# Patient Record
Sex: Female | Born: 1965 | Race: White | Hispanic: No | Marital: Married | Smoking: Never smoker
Health system: Southern US, Community
[De-identification: ages and names within clinical notes are randomized; demographics above are authoritative.]

## PROBLEM LIST (undated history)

## (undated) ENCOUNTER — Encounter

## (undated) ENCOUNTER — Telehealth

## (undated) ENCOUNTER — Ambulatory Visit

## (undated) ENCOUNTER — Other Ambulatory Visit

## (undated) DIAGNOSIS — N200 Calculus of kidney: Secondary | ICD-10-CM

## (undated) HISTORY — DX: Calculus of kidney: N20.0

## (undated) HISTORY — PX: REFRACTIVE SURGERY: SHX103

## (undated) HISTORY — PX: HERNIA REPAIR: SHX51

## (undated) HISTORY — PX: OTHER SURGICAL HISTORY: SHX169

## (undated) HISTORY — PX: CHOLECYSTECTOMY: SHX55

---

## 1996-10-12 HISTORY — PX: EYE SURGERY: SHX253

## 1998-10-12 HISTORY — PX: OTHER SURGICAL HISTORY: SHX169

## 2003-10-13 HISTORY — PX: OTHER SURGICAL HISTORY: SHX169

## 2005-10-12 HISTORY — PX: INFERIOR OBLIQUE MYECTOMY: SHX1814

## 2006-08-20 ENCOUNTER — Inpatient Hospital Stay (HOSPITAL_COMMUNITY): Admission: AD | Admit: 2006-08-20 | Discharge: 2006-08-20 | Payer: Self-pay | Admitting: Obstetrics and Gynecology

## 2007-03-04 ENCOUNTER — Ambulatory Visit (HOSPITAL_COMMUNITY): Admission: RE | Admit: 2007-03-04 | Discharge: 2007-03-04 | Payer: Self-pay | Admitting: Gynecology

## 2007-06-21 ENCOUNTER — Ambulatory Visit (HOSPITAL_COMMUNITY): Admission: RE | Admit: 2007-06-21 | Discharge: 2007-06-21 | Payer: Self-pay | Admitting: Nephrology

## 2007-09-01 ENCOUNTER — Ambulatory Visit (HOSPITAL_COMMUNITY): Admission: RE | Admit: 2007-09-01 | Discharge: 2007-09-01 | Payer: Self-pay | Admitting: Specialist

## 2007-09-02 ENCOUNTER — Ambulatory Visit (HOSPITAL_COMMUNITY): Admission: RE | Admit: 2007-09-02 | Discharge: 2007-09-02 | Payer: Self-pay | Admitting: Otolaryngology

## 2008-05-23 ENCOUNTER — Ambulatory Visit (HOSPITAL_COMMUNITY): Admission: RE | Admit: 2008-05-23 | Discharge: 2008-05-23 | Payer: Self-pay | Admitting: Obstetrics and Gynecology

## 2008-05-23 ENCOUNTER — Encounter (INDEPENDENT_AMBULATORY_CARE_PROVIDER_SITE_OTHER): Payer: Self-pay | Admitting: Obstetrics and Gynecology

## 2008-07-12 ENCOUNTER — Ambulatory Visit: Payer: Self-pay | Admitting: Internal Medicine

## 2008-08-03 ENCOUNTER — Ambulatory Visit: Payer: Self-pay | Admitting: Internal Medicine

## 2009-09-19 ENCOUNTER — Inpatient Hospital Stay (HOSPITAL_COMMUNITY): Admission: AD | Admit: 2009-09-19 | Discharge: 2009-09-21 | Payer: Self-pay | Admitting: Obstetrics and Gynecology

## 2009-11-14 ENCOUNTER — Inpatient Hospital Stay (HOSPITAL_COMMUNITY): Admission: RE | Admit: 2009-11-14 | Discharge: 2009-11-17 | Payer: Self-pay | Admitting: Obstetrics and Gynecology

## 2009-12-23 ENCOUNTER — Ambulatory Visit: Admission: RE | Admit: 2009-12-23 | Discharge: 2009-12-23 | Payer: Self-pay | Admitting: Obstetrics and Gynecology

## 2010-03-07 ENCOUNTER — Ambulatory Visit: Payer: Self-pay | Admitting: Internal Medicine

## 2010-04-24 IMAGING — US US OB TRANSVAGINAL
2 series · 13 of 28 positions shown · non-contrast
Comparison: Correlating renal ultrasound and prior CT from 3884

CLINICAL DATA: Evaluate the bladder due to poor imaging
transabdominally

TRANSVAGINAL OB ULTRASOUND
TECHNIQUE: Transvaginal ultrasound was performed for evaluation of
the gestation as well as the maternal uterus and adnexal regions.

[Series 1: us renal · 0.13mm/px · 4 of 18 slices shown (1 of 2)]
[im 3/18]
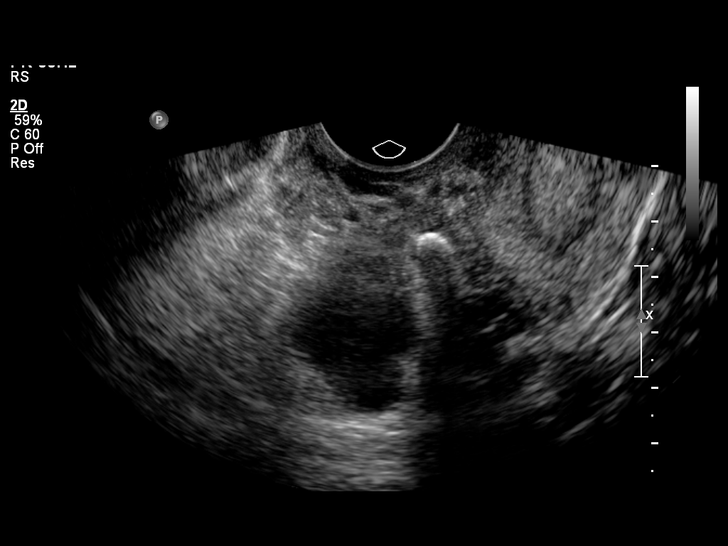
[im 7/18]
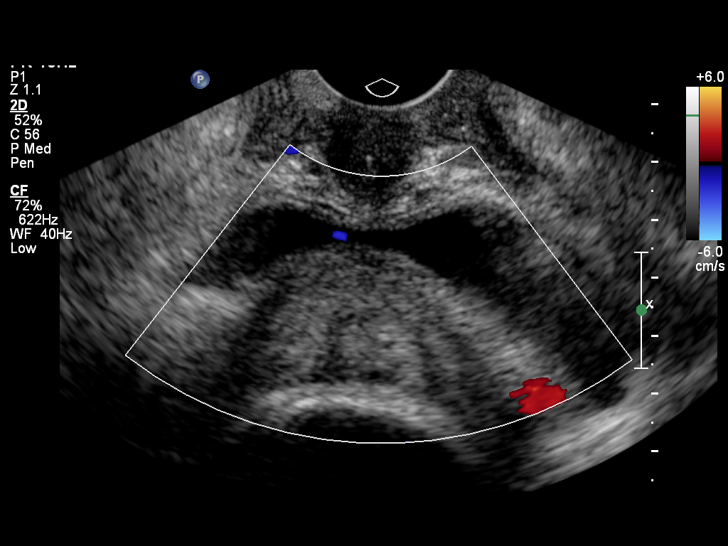
[im 11/18]
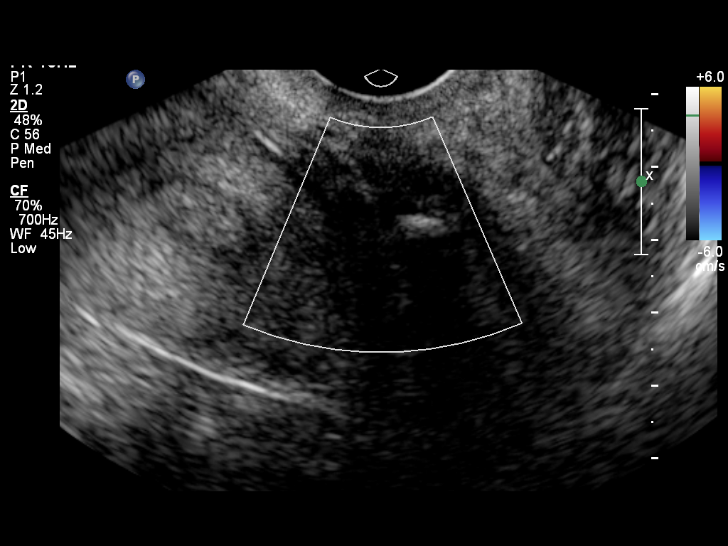
[im 15/18]
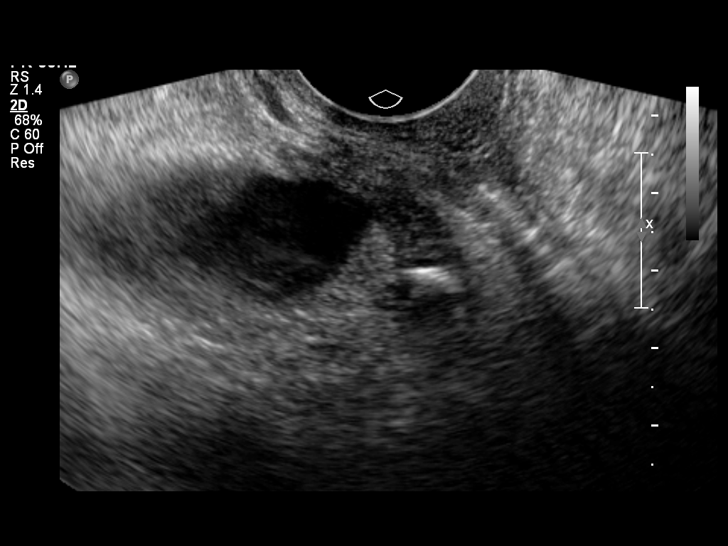

[Series 1: us renal · 0.27mm/px · 9 of 37 slices shown (2 of 2)]
[im 1/37]
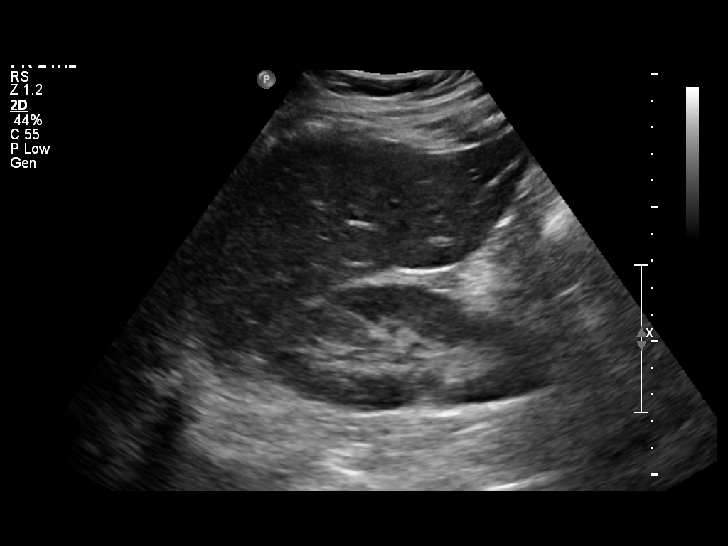
[im 5/37]
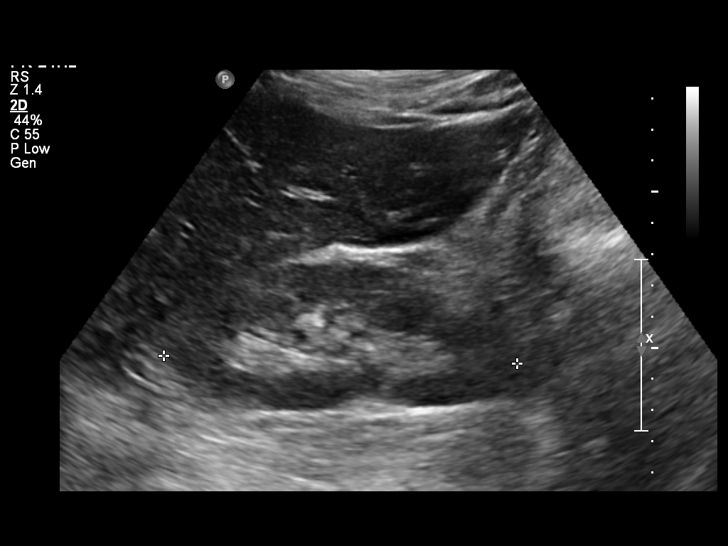
[im 11/37]
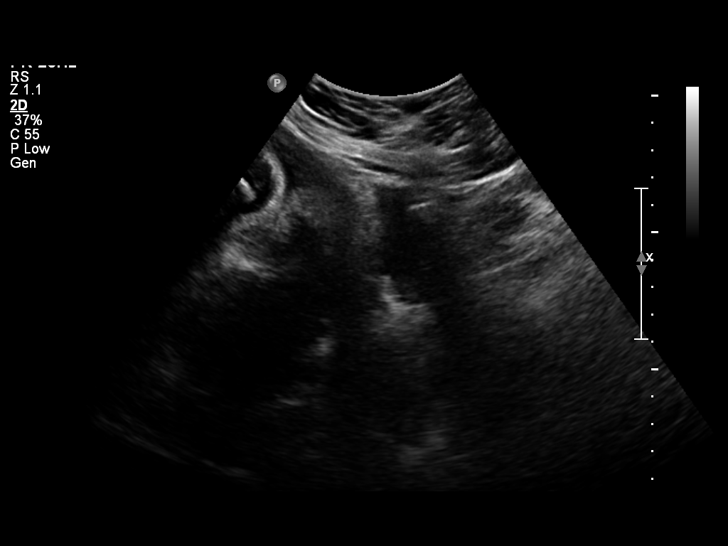
[im 15/37]
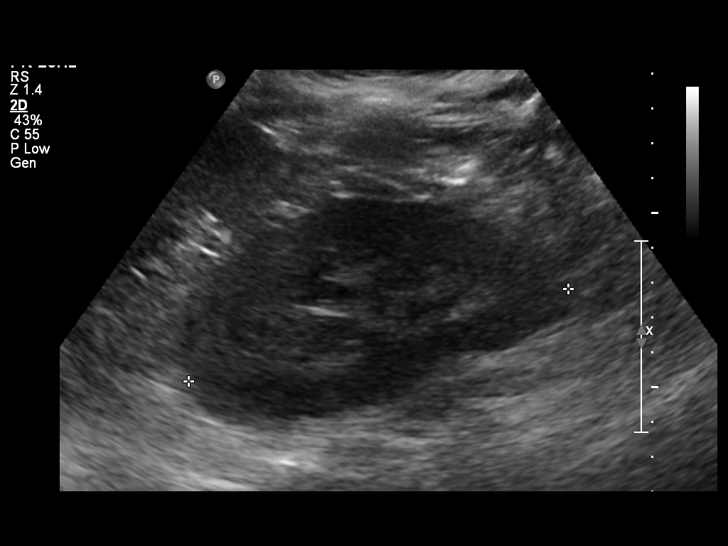
[im 19/37]
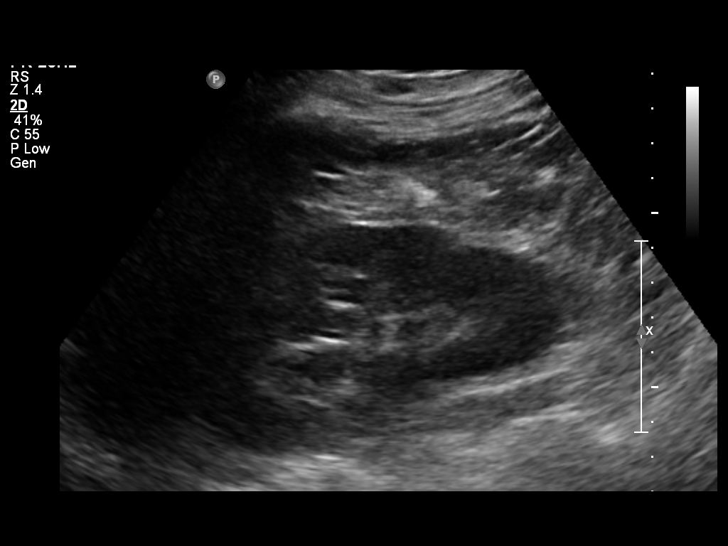
[im 23/37]
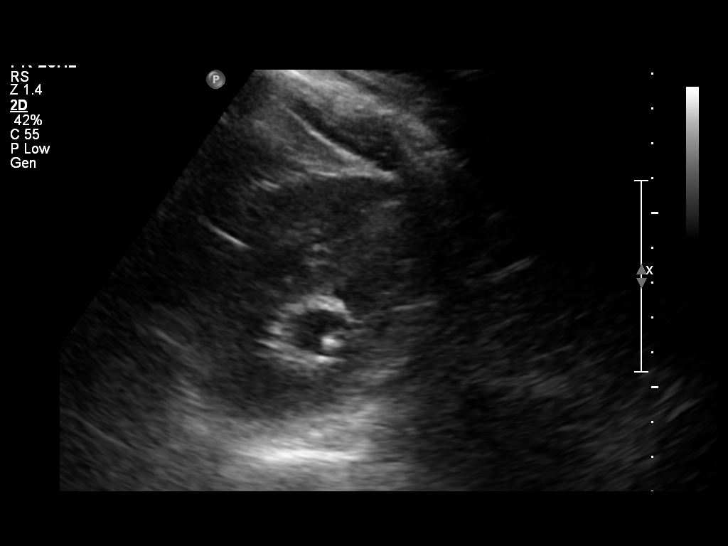
[im 27/37]
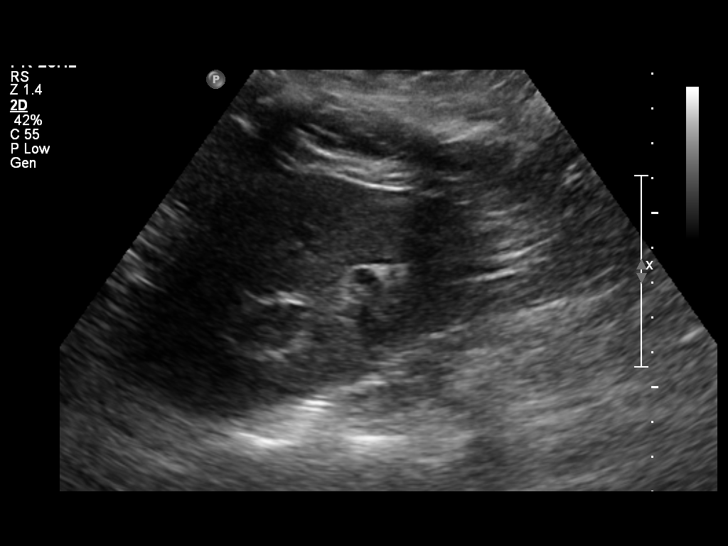
[im 31/37]
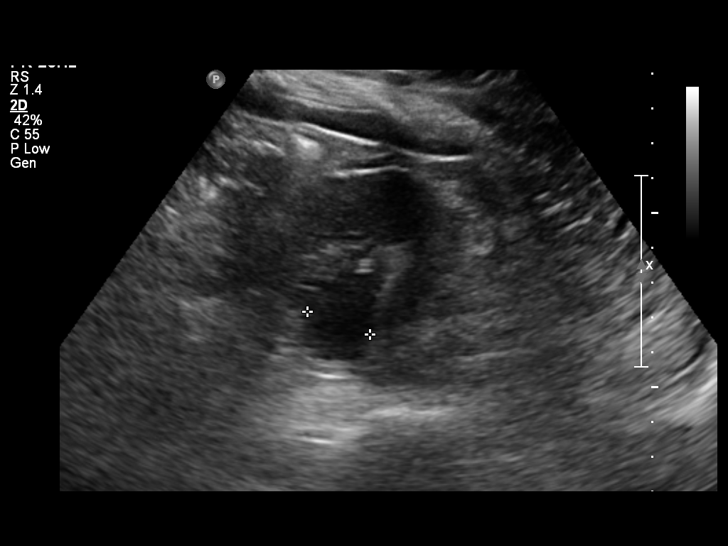
[im 35/37]
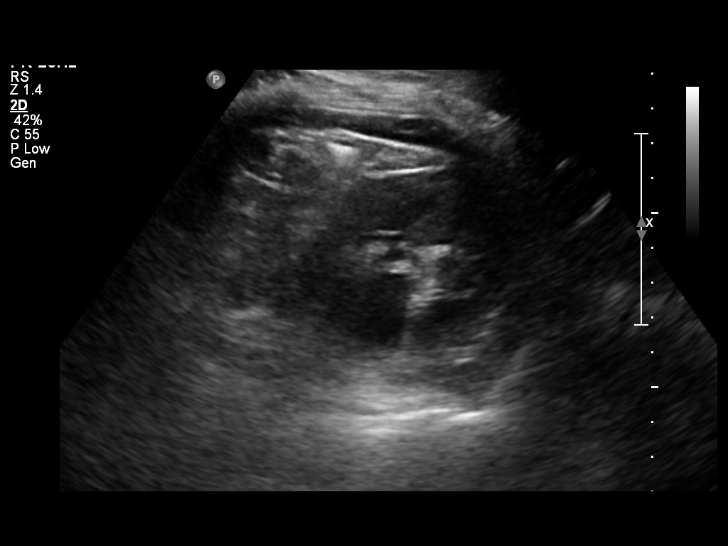

[13 of 28 positions shown; findings below may reference images not displayed]

FINDINGS: Transvaginal views reveal an incompletely filled bladder.
A right ureteral jet was seen with confidence.  A left ureteral jet
was questionably visualized one time although this may have been
the result of some motion artifact.

In the left hemi pelvis there is a focal calcification identified
measuring 6.5 x 6.5 mm.  This is noted in the expected location of
the distal left ureter although no fluid is identified behind this
to confirm that this is intraureteral. Prior CT from 3884 does not
demonstrate a pelvic phlebolith in this location and given the
patient's symptoms and intra renal findings, this is suspicious for
a distal left ureteral calculus.
IMPRESSION: Findings suspicious for a distal left ureteral calculus.  If
confirmation is desired, CT or MRI can be undertaken for complete
assessment.

## 2010-11-03 ENCOUNTER — Encounter: Payer: Self-pay | Admitting: Nephrology

## 2010-12-31 LAB — COMPREHENSIVE METABOLIC PANEL
ALT: 49 U/L — ABNORMAL HIGH (ref 0–35)
Albumin: 2.3 g/dL — ABNORMAL LOW (ref 3.5–5.2)
Alkaline Phosphatase: 120 U/L — ABNORMAL HIGH (ref 39–117)
Calcium: 8.7 mg/dL (ref 8.4–10.5)
Creatinine, Ser: 0.66 mg/dL (ref 0.4–1.2)
GFR calc non Af Amer: >60 mL/min (ref >60)
Glucose, Bld: 88 mg/dL (ref 70–99)
Sodium: 136 mEq/L (ref 135–145)
Total Bilirubin: 0.3 mg/dL (ref 0.3–1.2)

## 2010-12-31 LAB — CBC
HCT: 31.8 % — ABNORMAL LOW (ref 36.0–46.0)
HCT: 40.9 % (ref 36.0–46.0)
Hemoglobin: 13.8 g/dL (ref 12.0–15.0)
MCHC: 33.7 g/dL (ref 30.0–36.0)
MCV: 91.8 fL (ref 78.0–100.0)
Platelets: 236 10*3/uL (ref 150–400)
RBC: 3.42 MIL/uL — ABNORMAL LOW (ref 3.87–5.11)
RBC: 4.45 MIL/uL (ref 3.87–5.11)
RDW: 15.2 % (ref 11.5–15.5)
WBC: 7.8 10*3/uL (ref 4.0–10.5)
WBC: 9.7 10*3/uL (ref 4.0–10.5)

## 2010-12-31 LAB — URIC ACID: Uric Acid, Serum: 4.6 mg/dL (ref 2.4–7.0)

## 2010-12-31 LAB — LACTATE DEHYDROGENASE: LDH: 217 U/L (ref 94–250)

## 2011-01-13 LAB — URINALYSIS, ROUTINE W REFLEX MICROSCOPIC
Glucose, UA: NEGATIVE mg/dL
Ketones, ur: 15 mg/dL — AB
Nitrite: NEGATIVE
Protein, ur: NEGATIVE mg/dL
Specific Gravity, Urine: 1.02 (ref 1.005–1.030)

## 2011-01-13 LAB — URINE MICROSCOPIC-ADD ON

## 2011-02-24 NOTE — Op Note (Signed)
NAMELORENZA, Ashley Preston                   ACCOUNT NO.:  [id number]   MEDICAL RECORD NO.:  [id number]          PATIENT TYPE:  AMB   LOCATION:  SDC                           FACILITY:  [facility]   PHYSICIAN:  Michelle L. Grewal, M.D.DATE OF BIRTH:  Sep 15, 1966   DATE OF PROCEDURE:  05/23/2008  DATE OF DISCHARGE:                               OPERATIVE REPORT   PREOPERATIVE DIAGNOSIS:  Missed abortion.   POSTOPERATIVE DIAGNOSIS:  Missed abortion.   PROCEDURE:  DNA with chromosomes.   SURGEON:  Michelle L. Vincente Poli, MD   ANESTHESIA:  MAC with local.   FINDINGS:  Products of conception, sent to pathology.   ESTIMATED BLOOD LOSS:  Minimal.   COMPLICATIONS:  None.   PROCEDURE:  The patient was taken to the operating room.  She was given  anesthesia.  She was prepped and draped in usual sterile fashion.  In-  and-out catheter was used to empty the bladder.  Speculum was inserted  into the vagina.  The cervix was grasped with a tenaculum, and a  paracervical block was performed in a standard fashion.  The cervical  internal os was gently dilated using Pratt dilators.  The #7 suction  cannula was inserted and a suction curettage was performed with  retrieval of products of conception.  The suction cannula was removed  and a sharp curette was inserted.  The uterus thoroughly curetted of all  tissue.  The curette was then removed.  A final suction curettage was  performed and the uterine cavity was completely clean.  There was no  bleeding noted at the end of the procedure.  Half of the tissue was sent  for routine pathology analysis.  The other was sent for routine  chromosome karyotype per the patient and her husband's request.  Also  after all of the instruments were removed from the vagina, the patient  had requested prior to going to the operating room for me to remove a  small skin tag near the 7 o'clock position in the rectal area.  Exam  revealed very small skin tag consistent with the  patient's report.  The  area was prepped and local was infiltrated and a small skin tag was  removed with a scalpel.  Silver nitrate was applied for hemostasis.  The  patient went to recovery room in stable condition.  All sponge, lap, and  instrument counts were correct x2.  She went to recovery room in stable  condition.      Michelle L. Vincente Poli, M.D.  Electronically Signed     MLG/MEDQ  D:  05/23/2008  T:  05/24/2008  Job:  [id number]

## 2011-05-27 ENCOUNTER — Encounter: Payer: Self-pay | Admitting: Internal Medicine

## 2011-05-29 ENCOUNTER — Other Ambulatory Visit: Payer: BC Managed Care – PPO | Admitting: Internal Medicine

## 2011-05-29 ENCOUNTER — Other Ambulatory Visit: Payer: Self-pay | Admitting: Internal Medicine

## 2011-05-29 DIAGNOSIS — Z0000 Encounter for general adult medical examination without abnormal findings: Secondary | ICD-10-CM

## 2011-05-30 LAB — COMPREHENSIVE METABOLIC PANEL
ALT: 31 U/L (ref 0–35)
Albumin: 4.1 g/dL (ref 3.5–5.2)
CO2: 25 mEq/L (ref 19–32)
Calcium: 9 mg/dL (ref 8.4–10.5)
Potassium: 4.4 mEq/L (ref 3.5–5.3)
Sodium: 139 mEq/L (ref 135–145)
Total Bilirubin: 0.3 mg/dL (ref 0.3–1.2)

## 2011-05-30 LAB — CBC WITH DIFFERENTIAL/PLATELET
Basophils Relative: 0 % (ref 0–1)
Eosinophils Relative: 5 % (ref 0–5)
Hemoglobin: 13.1 g/dL (ref 12.0–15.0)
Lymphocytes Relative: 26 % (ref 12–46)
MCV: 94.7 fL (ref 78.0–100.0)
Monocytes Absolute: 0.6 10*3/uL (ref 0.1–1.0)
Monocytes Relative: 8 % (ref 3–12)
Neutrophils Relative %: 61 % (ref 43–77)
Platelets: 268 10*3/uL (ref 150–400)

## 2011-05-30 LAB — LIPID PANEL
Cholesterol: 166 mg/dL (ref 0–200)
Triglycerides: 67 mg/dL (ref <150)
VLDL: 13 mg/dL (ref 0–40)

## 2011-05-30 LAB — TSH: TSH: 3.689 u[IU]/mL (ref 0.350–4.500)

## 2011-06-01 ENCOUNTER — Ambulatory Visit (INDEPENDENT_AMBULATORY_CARE_PROVIDER_SITE_OTHER): Payer: BC Managed Care – PPO | Admitting: Internal Medicine

## 2011-06-01 ENCOUNTER — Encounter: Payer: Self-pay | Admitting: Internal Medicine

## 2011-06-01 VITALS — BP 110/68 | HR 72 | Temp 98.2°F | Ht 61.0 in | Wt 149.0 lb

## 2011-06-01 DIAGNOSIS — D329 Benign neoplasm of meninges, unspecified: Secondary | ICD-10-CM

## 2011-06-01 DIAGNOSIS — F329 Major depressive disorder, single episode, unspecified: Secondary | ICD-10-CM

## 2011-06-01 DIAGNOSIS — Z0000 Encounter for general adult medical examination without abnormal findings: Secondary | ICD-10-CM

## 2011-06-01 DIAGNOSIS — F419 Anxiety disorder, unspecified: Secondary | ICD-10-CM | POA: Insufficient documentation

## 2011-06-01 DIAGNOSIS — D320 Benign neoplasm of cerebral meninges: Secondary | ICD-10-CM

## 2011-06-01 DIAGNOSIS — F411 Generalized anxiety disorder: Secondary | ICD-10-CM

## 2011-06-01 LAB — POCT URINALYSIS DIPSTICK
Bilirubin, UA: NEGATIVE
Blood, UA: NEGATIVE
Glucose, UA: NEGATIVE
Nitrite, UA: NEGATIVE

## 2011-06-01 LAB — HEMOGLOBIN A1C
Hgb A1c MFr Bld: 5.6 % (ref <5.7)
Mean Plasma Glucose: 114 mg/dL (ref <117)

## 2011-06-01 NOTE — Progress Notes (Signed)
Subjective:    Patient ID: Ashley Preston, female    DOB: 07-29-1966, 45 y.o.   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 Has an 11-month-old daughter she delivered by cesarean section February 2011. Over the past year has noticed she is less patient typically with driving situations. Mother has kidney failure and is on dialysis. She has a lot of commands on her. She's tearful relating this in the office today and I think she may be depressed but she is unwilling to consider antidepressant therapy. She's had considerable I issues and has been seen at [facility] health care referred by Dr. Luciana Axe. History of Lasix surgery of right 06/30/1997, CME and macular pucker right eye, vitreous detachment both eyes, retinoschisis right eye.  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 History of GE reflux and is taken generic Protonix in the past. Does not smoke or consume alcohol. Patient has had some issues with menses. Dr. Vincente Poli is her GYN physician and she has been taking Camila to regulate her periods. She thinks her something wrong with her  hormones causing her emotional lability. Explained to her that I thought it was more of a problem with serotonin been on hormone issue. She says that she was told she had a meningioma in [place] the records from Dr. Luciana Axe indicate it was a left frontotemporal hemangioma instead. We do not have any old records from [place] to confirm what type of tumor she had. 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 She was to have another MRI because  she said Dr. in [place] told her tumor could recur after pregnancy and her daughters now 1 months old. She has no headaches periods complaining of some left visual disturbance where she says she sees bright light. I've asked her to see her ophthalmologist about this.  Family history remarkable for mother who is on dialysis with history of hypertension    Review of Systems  Constitutional: Positive for fatigue.  HENT:       Complains of intermittent numbness posterior neck lasting about a minute from time to time  Eyes:       Complains of seeing bright lights left eye only recently  Respiratory: Negative.   Cardiovascular: Negative.   Genitourinary: Positive for pelvic pain.       Sometimes has left groin pain when moving her child out of the car, sometimes has right lower quadrant abdominal pain in bed when she rolls over at night  Neurological: Negative.   Hematological: Negative.   Psychiatric/Behavioral: Positive for dysphoric mood, decreased concentration and agitation.       Objective:   Physical Exam  Vitals reviewed. Constitutional: She is oriented to person, place, and time. She appears well-nourished.  HENT:  Head: Normocephalic and atraumatic.  Right Ear: External ear normal.  Left Ear: External ear normal.  Mouth/Throat: Oropharynx is clear and moist. No oropharyngeal exudate.  Eyes: Conjunctivae are normal. Right eye exhibits no discharge. Left eye exhibits no discharge. Scleral icterus is present.  Neck: Normal range of motion. Neck supple. No JVD present. No thyromegaly present.  Cardiovascular:  Normal rate, regular rhythm and normal heart sounds.   No murmur heard. Pulmonary/Chest: Effort normal and breath sounds normal. No respiratory distress. She has no wheezes. She has no rales.  Abdominal: Soft. Bowel sounds are normal. She exhibits no distension and no mass. There is no tenderness. There is no rebound and no guarding.  Genitourinary:       Deferred to GYN  physician  Musculoskeletal: She exhibits no edema.  Lymphadenopathy:    She has no cervical adenopathy.  Neurological: She is alert and oriented to person, place, and time. She has normal reflexes. She displays normal reflexes. No cranial nerve deficit. She exhibits normal muscle tone. Coordination normal.  Skin: No rash noted. She is not diaphoretic.  Psychiatric:       Depressed affect. Tearful in the office relating issues of emotional lability          Assessment & Plan:  Depression  Anxiety  History of multiple ophthalmology problems including bilateral vitreous detachments  Status post removal of brain tumor? Meningioma in [place]. No focal deficits on brief neurological exam today. Patient complaining of seeing bright lights out of left eye and should seek ophthalmology consultation with her ophthalmologist.  Musculoskeletal pain left groin and likely right neck and possibly right lower abdomen. Patient was reassured these issues are likely benign. They do not last long and are sometimes associated with physical activity. I offered patient antidepressant therapy and she declined. I have given her the name of a counselor. Return 1 year or when necessary. Her lab work was reviewed with her today and is essentially normal. This includes TSH which is 3.689 and needs to be monitored on a yearly basis. She may eventually become hypothyroid. She was to be checked for glucose intolerance and we have added a hemoglobin A1c to her lab work

## 2011-06-02 ENCOUNTER — Encounter: Payer: Self-pay | Admitting: Internal Medicine

## 2011-06-22 ENCOUNTER — Ambulatory Visit
Admission: RE | Admit: 2011-06-22 | Discharge: 2011-06-22 | Disposition: A | Discharge status: Home / Self Care | Payer: BC Managed Care – PPO | Source: Ambulatory Visit | Attending: Internal Medicine | Admitting: Internal Medicine

## 2011-06-22 ENCOUNTER — Encounter: Payer: Self-pay | Admitting: Internal Medicine

## 2011-06-22 DIAGNOSIS — D329 Benign neoplasm of meninges, unspecified: Secondary | ICD-10-CM

## 2011-06-22 MED ORDER — GADOBENATE DIMEGLUMINE 529 MG/ML IV SOLN
14.0000 mL | Freq: Once | INTRAVENOUS | Status: AC | PRN
  Administered 2011-06-22: 14 mL via INTRAVENOUS

## 2011-06-24 ENCOUNTER — Telehealth: Payer: Self-pay | Admitting: Internal Medicine

## 2011-06-24 NOTE — Telephone Encounter (Signed)
Pt was advised MRI was normal.  No change from MRI done in 2008.  Pt verbalized understanding.

## 2011-07-10 LAB — CBC
HCT: 43.8
Platelets: 285
RDW: 12.9
WBC: 6.4

## 2011-12-14 ENCOUNTER — Ambulatory Visit (INDEPENDENT_AMBULATORY_CARE_PROVIDER_SITE_OTHER): Payer: BC Managed Care – PPO | Admitting: Internal Medicine

## 2011-12-14 ENCOUNTER — Encounter: Payer: Self-pay | Admitting: Internal Medicine

## 2011-12-14 VITALS — BP 108/78 | HR 76 | Temp 98.1°F | Wt 153.0 lb

## 2011-12-14 DIAGNOSIS — R200 Anesthesia of skin: Secondary | ICD-10-CM

## 2011-12-14 DIAGNOSIS — Z87898 Personal history of other specified conditions: Secondary | ICD-10-CM

## 2011-12-14 DIAGNOSIS — Z86018 Personal history of other benign neoplasm: Secondary | ICD-10-CM | POA: Insufficient documentation

## 2011-12-14 DIAGNOSIS — R209 Unspecified disturbances of skin sensation: Secondary | ICD-10-CM

## 2011-12-15 LAB — TSH: TSH: 1.443 u[IU]/mL (ref 0.350–4.500)

## 2011-12-17 ENCOUNTER — Telehealth: Payer: Self-pay

## 2011-12-17 NOTE — Telephone Encounter (Signed)
Patient scheduled for an appointment with dr. Teressa Senter on 12/28/2011 at 9:00 am. Notified of this.

## 2012-01-10 NOTE — Progress Notes (Signed)
  Subjective:    Patient ID: Ashley Preston, female    DOB: 02/26/1966, 46 y.o.   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 She's been concerned because she has a history of resection of left meningioma 2005. Have her she's been doing a lot of typing on the computer recently. History of painful right wrist and painful left hand seen by Dr. Teressa Senter in April 2011. She was postpartum at the time and he felt her left hand was inflamed from an IV. 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 She was placed in a thumb spica splint. Patient was seen in may 2011 with generalized fatigue and joint pain. She appeared to be depressed at that time. Life is stressful. Lab work was within normal limits at the time. She was seen here in August 46 2012 had a normal CBC, C. met, TSH, vitamin D was low normal at 33.  She has a history of Lasik surgery right eye 1998, vitreous detachment of both eyes, Dermolate right eye which was debulked under general anesthesia at [facility]. 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 Had right inguinal hernia repair 1975, resection of benign tumor in right parotid gland in 1992, 1998 in 2000.  History of cholecystectomy , myomectomy 2007.  Complaining of numbness in her hands when her arms are bent at the elbows and some tingling in her neck.  Review of Systems     Objective:   Physical Exam Phalen's and Tinel's signs are positive. Muscle strength in the upper extremities is within normal limits. Deep tendon reflexes in the upper extremities 2+ and symmetrical.        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 Could try wearing bilateral wrist limits at night. I think patient is afraid that this represents a recurrence of the meningioma. Patient had an MRI  of the brain August 47 showed no evidence of recurrence of tumor. MRI was stable from previous study.

## 2012-01-10 NOTE — Patient Instructions (Signed)
We will arrange for nerve conduction studies. May want to try bilateral wrist splints at night.

## 2012-10-04 ENCOUNTER — Other Ambulatory Visit: Payer: Self-pay | Admitting: Obstetrics and Gynecology

## 2012-10-18 ENCOUNTER — Ambulatory Visit (INDEPENDENT_AMBULATORY_CARE_PROVIDER_SITE_OTHER): Payer: BC Managed Care – PPO | Admitting: General Surgery

## 2012-10-18 ENCOUNTER — Encounter (INDEPENDENT_AMBULATORY_CARE_PROVIDER_SITE_OTHER): Payer: Self-pay | Admitting: General Surgery

## 2012-10-18 VITALS — BP 136/64 | HR 60 | Temp 97.1°F | Resp 16 | Ht 62.0 in | Wt 151.0 lb

## 2012-10-18 DIAGNOSIS — K644 Residual hemorrhoidal skin tags: Secondary | ICD-10-CM | POA: Insufficient documentation

## 2012-10-18 NOTE — Patient Instructions (Signed)
We can remove skin tag at time of gyn surgery

## 2012-10-18 NOTE — Progress Notes (Signed)
Subjective:     Patient ID: Ashley Preston, female   DOB: 12-18-1965, 47 y.o.   MRN: [id number]  HPI We are asked to see the patient in consultation by Dr. Vincente Poli to evaluate her for her hemorrhoids. The patient is a 47 year old white female who began having problems with the hemorrhoid about 3 years ago during her pregnancy. She states that her hemorrhoids got swollen during her pregnancy and never went away after she delivered. She has some occasional discomfort and bleeding associated with the area. She feels as though the hemorrhoid is large enough that it makes it difficult for her to get clean. She denies any constipation.  Review of Systems  Constitutional: Negative.   HENT: Negative.   Eyes: Negative.   Respiratory: Negative.   Cardiovascular: Negative.   Gastrointestinal: Positive for rectal pain.  Genitourinary: Negative.   Musculoskeletal: Negative.   Skin: Negative.   Neurological: Negative.   Hematological: Negative.   Psychiatric/Behavioral: Negative.        Objective:   Physical Exam  Constitutional: She is oriented to person, place, and time. She appears well-developed and well-nourished.  HENT:  Head: Normocephalic and atraumatic.  Eyes: Conjunctivae normal and EOM are normal. Pupils are equal, round, and reactive to light.  Neck: Normal range of motion. Neck supple.  Cardiovascular: Normal rate, regular rhythm and normal heart sounds.   Pulmonary/Chest: Effort normal and breath sounds normal.  Abdominal: Soft. Bowel sounds are normal.  Genitourinary:       She has a small soft posterior external hemorrhoidal skin tag. She also has some slightly decreased rectal tone.  Musculoskeletal: Normal range of motion.  Neurological: She is alert and oriented to person, place, and time.  Skin: Skin is warm and dry.  Psychiatric: She has a normal mood and affect. Her behavior is normal.       Assessment:     The patient has a posterior external hemorrhoidal skin tag.  It bothers her and she would like to have it removed. She is planning on having a GYN procedure done at some point in the near future. I think we can coordinate with her gynecologist to remove the skin tag at the same time. I discussed with her in detail the risks and benefits of the operation remove the skin tag as well as some of the technical aspects and she understands and wishes to proceed    Plan:     Plan to remove the external hemorrhoidal skin tag at the time of her GYN procedure

## 2013-02-06 ENCOUNTER — Other Ambulatory Visit: Payer: BC Managed Care – PPO | Admitting: Internal Medicine

## 2013-02-06 DIAGNOSIS — Z0000 Encounter for general adult medical examination without abnormal findings: Secondary | ICD-10-CM

## 2013-02-06 LAB — COMPREHENSIVE METABOLIC PANEL
Albumin: 4.2 g/dL (ref 3.5–5.2)
Alkaline Phosphatase: 54 U/L (ref 39–117)
BUN: 12 mg/dL (ref 6–23)
Glucose, Bld: 80 mg/dL (ref 70–99)
Potassium: 4.3 mEq/L (ref 3.5–5.3)
Total Bilirubin: 0.6 mg/dL (ref 0.3–1.2)

## 2013-02-06 LAB — LIPID PANEL
Cholesterol: 179 mg/dL (ref 0–200)
HDL: 47 mg/dL (ref >39)
LDL Cholesterol: 109 mg/dL — ABNORMAL HIGH (ref 0–99)
Triglycerides: 114 mg/dL (ref <150)

## 2013-02-06 LAB — CBC WITH DIFFERENTIAL/PLATELET
Basophils Relative: 0 % (ref 0–1)
Eosinophils Absolute: 0.3 10*3/uL (ref 0.0–0.7)
HCT: 39.4 % (ref 36.0–46.0)
Hemoglobin: 12.9 g/dL (ref 12.0–15.0)
MCH: 28.5 pg (ref 26.0–34.0)
MCHC: 32.7 g/dL (ref 30.0–36.0)
Monocytes Absolute: 0.7 10*3/uL (ref 0.1–1.0)
Monocytes Relative: 9 % (ref 3–12)

## 2013-02-07 ENCOUNTER — Ambulatory Visit (INDEPENDENT_AMBULATORY_CARE_PROVIDER_SITE_OTHER): Payer: BC Managed Care – PPO | Admitting: Internal Medicine

## 2013-02-07 ENCOUNTER — Encounter: Payer: Self-pay | Admitting: Internal Medicine

## 2013-02-07 VITALS — BP 110/84 | HR 76 | Temp 98.0°F | Ht 61.75 in | Wt 148.0 lb

## 2013-02-07 DIAGNOSIS — Z0000 Encounter for general adult medical examination without abnormal findings: Secondary | ICD-10-CM

## 2013-02-07 DIAGNOSIS — R079 Chest pain, unspecified: Secondary | ICD-10-CM

## 2013-02-07 DIAGNOSIS — Z8669 Personal history of other diseases of the nervous system and sense organs: Secondary | ICD-10-CM

## 2013-02-07 DIAGNOSIS — F411 Generalized anxiety disorder: Secondary | ICD-10-CM

## 2013-02-07 DIAGNOSIS — Z87898 Personal history of other specified conditions: Secondary | ICD-10-CM

## 2013-02-07 DIAGNOSIS — Z86018 Personal history of other benign neoplasm: Secondary | ICD-10-CM

## 2013-02-07 LAB — POCT URINALYSIS DIPSTICK
Bilirubin, UA: NEGATIVE
Ketones, UA: NEGATIVE
Protein, UA: NEGATIVE
Spec Grav, UA: >=1.03

## 2013-02-17 NOTE — Patient Instructions (Addendum)
Return for EKG. Otherwise return one year or prn

## 2013-02-20 ENCOUNTER — Ambulatory Visit (INDEPENDENT_AMBULATORY_CARE_PROVIDER_SITE_OTHER): Payer: BC Managed Care – PPO | Admitting: Internal Medicine

## 2013-02-20 ENCOUNTER — Encounter: Payer: Self-pay | Admitting: Internal Medicine

## 2013-02-20 DIAGNOSIS — R0789 Other chest pain: Secondary | ICD-10-CM | POA: Insufficient documentation

## 2013-02-20 DIAGNOSIS — R071 Chest pain on breathing: Secondary | ICD-10-CM

## 2013-02-20 DIAGNOSIS — Z23 Encounter for immunization: Secondary | ICD-10-CM

## 2013-02-20 MED ORDER — TETANUS-DIPHTH-ACELL PERTUSSIS 5-2.5-18.5 LF-MCG/0.5 IM SUSP: 0.5000 mL | Freq: Once | INTRAMUSCULAR | Status: DC

## 2013-02-20 NOTE — Progress Notes (Signed)
  Subjective:    Patient ID: Ashley Preston, female    DOB: 1966-04-25, 47 y.o.   MRN: [id number]  HPI At last visit patient was complaining of chest pain. Says it started in her left anterior chest and radiated down her left arm. Unfortunately at that time, EKG machine was not functioning correctly. We have had her return today to have repeat EKG. Patient says since that time she had one episode of left parasternal chest pain radiating into her left arm. No shortness of breath.    Review of Systems     Objective:   Physical Exam She is tender in her left parasternal area. Chest clear to auscultation. No palpable left shoulder or humerus pain. EKG is within normal limits. Extremities without edema. Cardiac exam regular rate and rhythm.        Assessment & Plan:  Chest wall pain  Plan: Patient reassured. She is to contact us if she has recurrent symptoms. She seems anxious about this but I do not think this is cardiac chest pain.

## 2013-02-20 NOTE — Patient Instructions (Addendum)
Pt reassured this is likely chest wall pain. Call if symptoms persist

## 2013-03-28 ENCOUNTER — Ambulatory Visit: Payer: BC Managed Care – PPO | Admitting: Internal Medicine

## 2013-07-02 NOTE — Progress Notes (Signed)
Subjective:    Patient ID: Ashley Preston, female    DOB: 28-Dec-1965, 47 y.o.   MRN: [id number]  HPI 47 year old white female in today for health maintenance and evaluation of medical problems. Has been having some chest pain recently. No shortness of breath. No radiation of pain. No diaphoresis nausea or vomiting.   Patient works as a Engineer, civil (consulting) at [employer]. She has a history of resection of left meningioma in 2005. 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 This was treated by Dr. Dora Sims for with a thumb spica splint.  In May 2011 she was seen with generalized fatigue and joint pain.. Be depressed at that time. Lab work including TSH was normal. History of low vitamin D level in August 2012.  History of Lasix surgery right 06/30/1997, history of vitreous detachment of both eyes, history of retinoschisis right eye. History of macular pucker right eye.  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 History of cholecystectomy in 2004. Myomectomy done in 2007. History of carpal tunnels and 2013  GYN physician is Dr. Vincente Poli.  Intolerant of sulfa, it causes rash and itching   intolerant of cloth tape and paper tape-local reactions  Intolerant of Percocet it causes a rash  Intolerant of Vicodin it causes a rash  Patient has had one pregnancy and one miscarriage.  History of kidney stones both right and left kidneys.  Family history: Father with history of COPD, mother with history of hypertension. 2 brothers and 2 sisters in good health.   Social history: One child. Does not smoke or drink. Married. Husband is a Technical brewer. Patient has a 4 year college degree.    Review of Systems  Constitutional: Positive for fatigue.  HENT: Negative.   Eyes: Negative.   Respiratory: Negative.   Cardiovascular: Positive for chest pain.  Gastrointestinal: Negative.    Endocrine: Negative.   Allergic/Immunologic: Negative.   Neurological: Negative.   Hematological: Negative.   Psychiatric/Behavioral:       Anxious       Objective:   Physical Exam  Vitals reviewed. Constitutional: She is oriented to person, place, and time. She appears well-developed and well-nourished. No distress.  HENT:  Head: Normocephalic.  Right Ear: External ear normal.  Left Ear: External ear normal.  Mouth/Throat: Oropharynx is clear and moist. No oropharyngeal exudate.  Eyes: Conjunctivae and EOM are normal. Pupils are equal, round, and reactive to light. Right eye exhibits no discharge. Left eye exhibits no discharge. No scleral icterus.  Neck: Neck supple. No JVD present. No thyromegaly present.  Cardiovascular: Normal rate, regular rhythm, normal heart sounds and intact distal pulses.   No murmur heard. Today denies chest wall pain on palpation  Pulmonary/Chest: Effort normal and breath sounds normal. No respiratory distress. She has no wheezes. She has no rales.  Breasts normal female  Abdominal: Soft. Bowel sounds are normal. She exhibits no distension and no mass. There is no tenderness. There is no rebound and no guarding.  Genitourinary:  Deferred to GYN  Musculoskeletal: Normal range of motion. She exhibits no edema.  Lymphadenopathy:    She has no cervical adenopathy.  Neurological: She is alert and oriented to person, place, and time. She has normal reflexes. No cranial nerve deficit. Coordination normal.  Skin: Skin is warm and dry. No rash noted. She is not diaphoretic.  Psychiatric: She has a normal mood and affect. Her behavior  is normal. Judgment and thought content normal.          Assessment & Plan:  Chest pain-my feeling is this is chest wall pain. Unfortunately EKG machine is not functioning properly today. I do not think she has cardiac etiology for chest pain at this point in time. Would like for her to return however for an EKG  .  Anxiety  History of meningioma  History carpal tunnel syndrome  Return for EKG otherwise return in one year or when necessary

## 2013-07-20 ENCOUNTER — Other Ambulatory Visit: Payer: Self-pay | Admitting: Obstetrics and Gynecology

## 2013-07-20 DIAGNOSIS — R2231 Localized swelling, mass and lump, right upper limb: Secondary | ICD-10-CM

## 2013-08-01 ENCOUNTER — Ambulatory Visit
Admission: RE | Admit: 2013-08-01 | Discharge: 2013-08-01 | Disposition: A | Discharge status: Home / Self Care | Payer: BC Managed Care – PPO | Source: Ambulatory Visit | Attending: Obstetrics and Gynecology | Admitting: Obstetrics and Gynecology

## 2013-08-01 DIAGNOSIS — R2231 Localized swelling, mass and lump, right upper limb: Secondary | ICD-10-CM

## 2013-10-24 ENCOUNTER — Other Ambulatory Visit: Payer: Self-pay | Admitting: Obstetrics and Gynecology

## 2013-11-08 ENCOUNTER — Other Ambulatory Visit: Payer: Self-pay | Admitting: Obstetrics and Gynecology

## 2013-11-08 DIAGNOSIS — R928 Other abnormal and inconclusive findings on diagnostic imaging of breast: Secondary | ICD-10-CM

## 2013-11-10 ENCOUNTER — Encounter (INDEPENDENT_AMBULATORY_CARE_PROVIDER_SITE_OTHER): Payer: Self-pay

## 2013-11-10 ENCOUNTER — Ambulatory Visit (INDEPENDENT_AMBULATORY_CARE_PROVIDER_SITE_OTHER): Payer: BC Managed Care – PPO | Admitting: General Surgery

## 2013-11-10 VITALS — BP 126/76 | HR 80 | Temp 98.0°F | Resp 18 | Ht 62.0 in | Wt 153.0 lb

## 2013-11-10 DIAGNOSIS — K644 Residual hemorrhoidal skin tags: Secondary | ICD-10-CM

## 2013-11-10 NOTE — Progress Notes (Signed)
Patient ID: Ashley Preston, female   DOB: 1966-08-28, 49 y.o.   MRN: [id number]  Chief Complaint  Patient presents with  . Establish Care    hems    HPI Ashley Preston is a 48 y.o. female.  The patient is a 48 year old female who we saw last year with a external hemorrhoid. We had planned to excise this during a GYN procedure that she was having. She never scheduled either surgery. She returns today ready to schedule both surgeries. She continues to complain of discomfort in her perirectal region at the location of the hemorrhoid. She denies any bleeding with her bowel movements.  HPI  Past Medical History  Diagnosis Date  . Kidney stones     Past Surgical History  Procedure Laterality Date  . Hernia repair    . Cholecystectomy    . Cesarean section    . Resection of benign tumor in right parotid gland  [phone]  . Radiotherapy of right parietal gland  2000  . Eye surgery  1998    lasik  . Resection of left temporal frontal hemangioma  2005  . Inferior oblique myectomy  2007  . Refractive surgery      Family History  Problem Relation Age of Onset  . Hypertension Mother   . COPD Father   . Cancer Paternal Uncle     colon    Social History History  Substance Use Topics  . Smoking status: Never Smoker   . Smokeless tobacco: Never Used  . Alcohol Use: No    Allergies  Allergen Reactions  . Darvocet [Propoxyphene N-Acetaminophen] Itching and Rash  . Morphine And Related Rash  . Percocet [Oxycodone-Acetaminophen] Rash  . Sulfa Antibiotics Itching and Rash  . Tape Rash    Cloth tape & paper tape  . Vicodin [Hydrocodone-Acetaminophen] Rash    Current Outpatient Prescriptions  Medication Sig Dispense Refill  . Multiple Vitamin (MULTI VITAMIN DAILY PO) Take by mouth.      . pantoprazole (PROTONIX) 40 MG tablet Take 40 mg by mouth daily.        . vitamin E (VITAMIN E) 1000 UNIT capsule Take 400 Units by mouth daily.        No current facility-administered  medications for this visit.    Review of Systems Review of Systems  Constitutional: Negative.   HENT: Negative.   Eyes: Negative.   Respiratory: Negative.   Cardiovascular: Negative.   Gastrointestinal: Negative.   Endocrine: Negative.   Genitourinary: Negative.   Musculoskeletal: Negative.   Skin: Negative.   Allergic/Immunologic: Negative.   Neurological: Negative.   Hematological: Negative.   Psychiatric/Behavioral: Negative.     Blood pressure 126/76, pulse 80, temperature 98 F (36.7 C), resp. rate 18, height 5\' 2"  (1.575 m), weight 153 lb (69.4 kg).  Physical Exam Physical Exam  Constitutional: She is oriented to person, place, and time. She appears well-developed and well-nourished.  HENT:  Head: Normocephalic and atraumatic.  Eyes: Conjunctivae and EOM are normal. Pupils are equal, round, and reactive to light.  Neck: Normal range of motion. Neck supple.  Cardiovascular: Normal rate, regular rhythm and normal heart sounds.   Pulmonary/Chest: Effort normal and breath sounds normal.  Abdominal: Soft. Bowel sounds are normal.  Genitourinary:  There is a small benign appearing external hemorrhoidal skin tag in the left posterior lateral position that is the location of her discomfort  Musculoskeletal: Normal range of motion.  Lymphadenopathy:    She has no cervical adenopathy.  Neurological:  She is alert and oriented to person, place, and time.  Skin: Skin is warm and dry.  Psychiatric: She has a normal mood and affect. Her behavior is normal.    Data Reviewed As above  Assessment    The patient appears to have a external hemorrhoidal skin tag that is causing her discomfort. She would like to have this removed during a GYN procedure that she is getting ready to schedule. I think this would be a reasonable thing to do. If she has any other internal hemorrhoids we will plan to band them as well. I've discussed with her in detail the risks and benefits of the  operation to this as well as some of the technical aspects and she understands and wishes to proceed     Plan    Plan for external hemorrhoidectomy and possible internal hemorrhoid banding during her GYN procedure. We will coordinate this with Dr. Sunday Spillers III,Shreya Lacasse S 11/10/2013, 12:11 PM

## 2013-11-10 NOTE — Patient Instructions (Signed)
Plan for external hemorrhoidectomy and possible internal hemorrhoid banding

## 2013-11-20 ENCOUNTER — Other Ambulatory Visit: Payer: BC Managed Care – PPO

## 2013-11-29 ENCOUNTER — Other Ambulatory Visit: Payer: BC Managed Care – PPO

## 2013-12-11 ENCOUNTER — Other Ambulatory Visit: Payer: Self-pay | Admitting: Obstetrics and Gynecology

## 2013-12-11 ENCOUNTER — Ambulatory Visit
Admission: RE | Admit: 2013-12-11 | Discharge: 2013-12-11 | Disposition: A | Discharge status: Home / Self Care | Payer: BC Managed Care – PPO | Source: Ambulatory Visit | Attending: Obstetrics and Gynecology | Admitting: Obstetrics and Gynecology

## 2013-12-11 DIAGNOSIS — R928 Other abnormal and inconclusive findings on diagnostic imaging of breast: Secondary | ICD-10-CM

## 2013-12-11 DIAGNOSIS — N6320 Unspecified lump in the left breast, unspecified quadrant: Secondary | ICD-10-CM

## 2013-12-21 ENCOUNTER — Ambulatory Visit
Admission: RE | Admit: 2013-12-21 | Discharge: 2013-12-21 | Disposition: A | Discharge status: Home / Self Care | Payer: BC Managed Care – PPO | Source: Ambulatory Visit | Attending: Obstetrics and Gynecology | Admitting: Obstetrics and Gynecology

## 2013-12-21 DIAGNOSIS — N6320 Unspecified lump in the left breast, unspecified quadrant: Secondary | ICD-10-CM

## 2013-12-25 ENCOUNTER — Encounter (INDEPENDENT_AMBULATORY_CARE_PROVIDER_SITE_OTHER): Payer: Self-pay

## 2013-12-25 ENCOUNTER — Telehealth (INDEPENDENT_AMBULATORY_CARE_PROVIDER_SITE_OTHER): Payer: Self-pay

## 2013-12-25 ENCOUNTER — Other Ambulatory Visit (INDEPENDENT_AMBULATORY_CARE_PROVIDER_SITE_OTHER): Payer: Self-pay | Admitting: General Surgery

## 2013-12-25 NOTE — Telephone Encounter (Signed)
Message copied by Carlene Coria on Mon Dec 25, 2013 10:43 AM ------      Message from: Tennis Ship      Created: Thu Dec 21, 2013  2:02 PM       Please put orders in epic and send face sheet around to scheduling. Dr. Gracy Racer office is ready to coordinate case.            Thanks,      Colletta [place] ------

## 2013-12-25 NOTE — Telephone Encounter (Signed)
Orders given to [facility]. Miralax/ no abx mailed to pt.

## 2014-01-29 ENCOUNTER — Other Ambulatory Visit (INDEPENDENT_AMBULATORY_CARE_PROVIDER_SITE_OTHER): Payer: Self-pay

## 2014-01-29 ENCOUNTER — Other Ambulatory Visit (INDEPENDENT_AMBULATORY_CARE_PROVIDER_SITE_OTHER): Payer: Self-pay | Admitting: General Surgery

## 2014-01-29 DIAGNOSIS — K644 Residual hemorrhoidal skin tags: Secondary | ICD-10-CM

## 2014-01-29 DIAGNOSIS — K648 Other hemorrhoids: Secondary | ICD-10-CM

## 2014-01-29 MED ORDER — TRAMADOL HCL 50 MG PO TABS: 50.0000 mg | ORAL_TABLET | Freq: Four times a day (QID) | ORAL | Status: DC | PRN

## 2014-01-29 MED ORDER — DIBUCAINE 1 % EX OINT: TOPICAL_OINTMENT | Freq: Three times a day (TID) | CUTANEOUS | Status: DC | PRN

## 2014-02-02 ENCOUNTER — Telehealth (INDEPENDENT_AMBULATORY_CARE_PROVIDER_SITE_OTHER): Payer: Self-pay

## 2014-02-02 NOTE — Telephone Encounter (Signed)
Message copied by Carlene Coria on Fri Feb 02, 2014  1:40 PM ------      Message from: [facility]      Created: Fri Feb 02, 2014 10:05 AM      Contact: [phone]       SHE HAD SURGERY ON THE 20TH AND SHE NEEDS A PO APPT FOR THREE WEEKS ------

## 2014-02-02 NOTE — Telephone Encounter (Signed)
Called pt with appt 

## 2014-02-21 ENCOUNTER — Encounter (INDEPENDENT_AMBULATORY_CARE_PROVIDER_SITE_OTHER): Payer: Self-pay | Admitting: General Surgery

## 2014-02-21 ENCOUNTER — Ambulatory Visit (INDEPENDENT_AMBULATORY_CARE_PROVIDER_SITE_OTHER): Payer: BC Managed Care – PPO | Admitting: General Surgery

## 2014-02-21 VITALS — BP 98/60 | HR 60 | Temp 97.6°F | Resp 14 | Ht 62.0 in | Wt 153.6 lb

## 2014-02-21 DIAGNOSIS — K644 Residual hemorrhoidal skin tags: Secondary | ICD-10-CM

## 2014-02-21 NOTE — Progress Notes (Signed)
Subjective:     Patient ID: Ashley Preston, female   DOB: May 07, 1966, 48 y.o.   MRN: [id number]  HPI The patient is 3 weeks status post a external hemorrhoidectomy and 2 internal hemorrhoid banding. She tolerated the surgery well. She complains only of some minor soreness and a small amount of blood on the toilet paper after a bowel movement. Otherwise his history doing very well. Her appetite is good and her bowels are working normally.  Review of Systems     Objective:   Physical Exam On exam her perirectal skin looks normal. The site of her hemorrhoidectomy is healing nicely with no sign of infection    Assessment:     The patient is 3 weeks status post hemorrhoidectomy and internal hemorrhoid banding     Plan:     At this point she will continue to keep her perirectal area clean and dry. She will use baby wipes after bowel movements. All plan to see her back in the next month or 2 to check her progress.

## 2014-02-21 NOTE — Patient Instructions (Signed)
Use baby wipes after bm's Keep area clean and dry

## 2014-04-03 ENCOUNTER — Ambulatory Visit (INDEPENDENT_AMBULATORY_CARE_PROVIDER_SITE_OTHER): Payer: BC Managed Care – PPO | Admitting: General Surgery

## 2014-04-03 ENCOUNTER — Encounter (INDEPENDENT_AMBULATORY_CARE_PROVIDER_SITE_OTHER): Payer: Self-pay | Admitting: General Surgery

## 2014-04-03 VITALS — BP 122/74 | HR 74 | Temp 98.1°F | Resp 16 | Ht 62.0 in | Wt 152.0 lb

## 2014-04-03 DIAGNOSIS — K644 Residual hemorrhoidal skin tags: Secondary | ICD-10-CM

## 2014-04-03 NOTE — Progress Notes (Signed)
Subjective:     Patient ID: Ashley Preston, female   DOB: 03/30/1966, 48 y.o.   MRN: [id number]  HPI The patient is a 48 year old white female who is about 2 months status post external hemorrhoidectomy and 2 internal hemorrhoid bandings. She tolerated the surgery well. She denies any rectal pain unless she gets constipated and has to push her heart. She has only had one episode of blood in her stool  Review of Systems     Objective:   Physical Exam On exam her perirectal skin looks good. She has a minimal external hemorrhoidal skin tag anteriorly. She has good rectal tone on digital exam and no palpable mass    Assessment:     The patient is 2 months status post external hemorrhoidectomy and 2 internal hemorrhoid bandings     Plan:     At this point she may return to her normal activities without restriction. She will use MiraLAX to help when she feels constipated. Otherwise I will see her back on a when necessary basis

## 2014-05-29 ENCOUNTER — Ambulatory Visit (INDEPENDENT_AMBULATORY_CARE_PROVIDER_SITE_OTHER): Payer: BC Managed Care – PPO | Admitting: Internal Medicine

## 2014-05-29 ENCOUNTER — Encounter: Payer: Self-pay | Admitting: Internal Medicine

## 2014-05-29 VITALS — BP 108/60 | HR 74 | Temp 97.7°F | Wt 150.0 lb

## 2014-05-29 DIAGNOSIS — T783XXA Angioneurotic edema, initial encounter: Secondary | ICD-10-CM

## 2014-05-29 DIAGNOSIS — M25529 Pain in unspecified elbow: Secondary | ICD-10-CM

## 2014-05-30 ENCOUNTER — Other Ambulatory Visit: Payer: Self-pay | Admitting: Internal Medicine

## 2014-05-30 LAB — COMPREHENSIVE METABOLIC PANEL
ALT: 15 U/L (ref 0–35)
AST: 24 U/L (ref 0–37)
Albumin: 3.8 g/dL (ref 3.5–5.2)
Alkaline Phosphatase: 49 U/L (ref 39–117)
BILIRUBIN TOTAL: 0.4 mg/dL (ref 0.2–1.2)
BUN: 15 mg/dL (ref 6–23)
CALCIUM: 9.1 mg/dL (ref 8.4–10.5)
CHLORIDE: 104 meq/L (ref 96–112)
CO2: 25 mEq/L (ref 19–32)
Creat: 0.78 mg/dL (ref 0.50–1.10)
Glucose, Bld: 87 mg/dL (ref 70–99)
Potassium: 4.6 mEq/L (ref 3.5–5.3)
SODIUM: 137 meq/L (ref 135–145)
Total Protein: 6.8 g/dL (ref 6.0–8.3)

## 2014-05-30 LAB — CBC WITH DIFFERENTIAL/PLATELET
Basophils Absolute: 0 10*3/uL (ref 0.0–0.1)
Basophils Relative: 1 % (ref 0–1)
EOS ABS: 0.2 10*3/uL (ref 0.0–0.7)
Eosinophils Relative: 5 % (ref 0–5)
HCT: 28.4 % — ABNORMAL LOW (ref 36.0–46.0)
HEMOGLOBIN: 8.9 g/dL — AB (ref 12.0–15.0)
LYMPHS ABS: 1.4 10*3/uL (ref 0.7–4.0)
Lymphocytes Relative: 33 % (ref 12–46)
MCH: 21.8 pg — AB (ref 26.0–34.0)
MCHC: 31.3 g/dL (ref 30.0–36.0)
MCV: 69.4 fL — ABNORMAL LOW (ref 78.0–100.0)
Monocytes Absolute: 0.3 10*3/uL (ref 0.1–1.0)
Monocytes Relative: 8 % (ref 3–12)
NEUTROS ABS: 2.3 10*3/uL (ref 1.7–7.7)
NEUTROS PCT: 53 % (ref 43–77)
PLATELETS: 350 10*3/uL (ref 150–400)
RBC: 4.09 MIL/uL (ref 3.87–5.11)
RDW: 17 % — ABNORMAL HIGH (ref 11.5–15.5)
WBC: 4.3 10*3/uL (ref 4.0–10.5)

## 2014-05-30 LAB — SEDIMENTATION RATE: SED RATE: 21 mm/h (ref 0–22)

## 2014-05-30 LAB — VITAMIN B12: Vitamin B-12: 882 pg/mL (ref 211–911)

## 2014-05-30 LAB — MAGNESIUM: Magnesium: 1.8 mg/dL (ref 1.5–2.5)

## 2014-05-30 LAB — T4, FREE: Free T4: 0.8 ng/dL (ref 0.80–1.80)

## 2014-05-30 LAB — RHEUMATOID FACTOR

## 2014-05-30 LAB — TSH: TSH: 1.473 u[IU]/mL (ref 0.350–4.500)

## 2014-05-31 LAB — IRON AND TIBC
%SAT: 4 % — ABNORMAL LOW (ref 20–55)
IRON: 18 ug/dL — AB (ref 42–145)
TIBC: 453 ug/dL (ref 250–470)
UIBC: 435 ug/dL — AB (ref 125–400)

## 2014-05-31 LAB — CYCLIC CITRUL PEPTIDE ANTIBODY, IGG

## 2014-05-31 LAB — ANA: Anti Nuclear Antibody(ANA): NEGATIVE

## 2014-07-09 ENCOUNTER — Other Ambulatory Visit: Payer: BC Managed Care – PPO | Admitting: Internal Medicine

## 2014-07-09 DIAGNOSIS — Z0000 Encounter for general adult medical examination without abnormal findings: Secondary | ICD-10-CM

## 2014-07-09 DIAGNOSIS — Z13220 Encounter for screening for lipoid disorders: Secondary | ICD-10-CM

## 2014-07-09 DIAGNOSIS — Z1329 Encounter for screening for other suspected endocrine disorder: Secondary | ICD-10-CM

## 2014-07-09 DIAGNOSIS — Z130 Encounter for screening for diseases of the blood and blood-forming organs and certain disorders involving the immune mechanism: Secondary | ICD-10-CM

## 2014-07-09 DIAGNOSIS — Z13228 Encounter for screening for other metabolic disorders: Secondary | ICD-10-CM

## 2014-07-09 NOTE — Progress Notes (Signed)
   Subjective:    Patient ID: Ashley Preston, female    DOB: 1966/02/05, 48 y.o.   MRN: [id number]  HPI  Patient in today complaining of nonspecific joint pain when she extends her arms. Has not noticed any particular redness or swelling of the joints just some tenderness. She works as a Marine scientist. Some issues with angioedema and itching. Has never been allergy tested.    Review of Systems     Objective:   Physical Exam  Joints look to be within normal limits without evidence of joint tenderness redness or swelling.      Assessment & Plan:  Joint pain  Angioedema  Plan: Refer to allergist regarding angioedema. Draw rheumatology labs including CBC with differential, Shanah, rheumatoid factor, CCP and sedimentation rate. Check B12 and magnesium. Check thyroid functions. May need to see rheumatologist.  25 minutes spent with patient

## 2014-07-09 NOTE — Patient Instructions (Signed)
We will notify you of lab results. See allergist regarding angioedema.

## 2014-07-10 ENCOUNTER — Encounter: Payer: Self-pay | Admitting: Internal Medicine

## 2014-07-10 ENCOUNTER — Ambulatory Visit (INDEPENDENT_AMBULATORY_CARE_PROVIDER_SITE_OTHER): Payer: BC Managed Care – PPO | Admitting: Internal Medicine

## 2014-07-10 VITALS — BP 110/68 | HR 72 | Temp 97.9°F | Resp 14 | Ht 61.25 in | Wt 153.0 lb

## 2014-07-10 DIAGNOSIS — L538 Other specified erythematous conditions: Secondary | ICD-10-CM

## 2014-07-10 DIAGNOSIS — T783XXS Angioneurotic edema, sequela: Secondary | ICD-10-CM

## 2014-07-10 DIAGNOSIS — Z0000 Encounter for general adult medical examination without abnormal findings: Secondary | ICD-10-CM

## 2014-07-10 DIAGNOSIS — L299 Pruritus, unspecified: Secondary | ICD-10-CM

## 2014-07-10 DIAGNOSIS — IMO0001 Reserved for inherently not codable concepts without codable children: Secondary | ICD-10-CM

## 2014-07-10 DIAGNOSIS — L304 Erythema intertrigo: Secondary | ICD-10-CM

## 2014-07-10 DIAGNOSIS — K219 Gastro-esophageal reflux disease without esophagitis: Secondary | ICD-10-CM

## 2014-07-10 DIAGNOSIS — M7918 Myalgia, other site: Secondary | ICD-10-CM

## 2014-07-10 LAB — CBC WITH DIFFERENTIAL/PLATELET
BASOS PCT: 1 % (ref 0–1)
Basophils Absolute: 0.1 10*3/uL (ref 0.0–0.1)
Eosinophils Absolute: 0.3 10*3/uL (ref 0.0–0.7)
Eosinophils Relative: 5 % (ref 0–5)
HEMATOCRIT: 38.8 % (ref 36.0–46.0)
Hemoglobin: 12.2 g/dL (ref 12.0–15.0)
LYMPHS PCT: 29 % (ref 12–46)
Lymphs Abs: 1.5 10*3/uL (ref 0.7–4.0)
MCH: 25.2 pg — AB (ref 26.0–34.0)
MCHC: 31.4 g/dL (ref 30.0–36.0)
MCV: 80.2 fL (ref 78.0–100.0)
MONO ABS: 0.4 10*3/uL (ref 0.1–1.0)
Monocytes Relative: 7 % (ref 3–12)
Neutro Abs: 3.1 10*3/uL (ref 1.7–7.7)
Neutrophils Relative %: 58 % (ref 43–77)
Platelets: 272 10*3/uL (ref 150–400)
RBC: 4.84 MIL/uL (ref 3.87–5.11)
WBC: 5.3 10*3/uL (ref 4.0–10.5)

## 2014-07-10 LAB — LIPID PANEL
Cholesterol: 165 mg/dL (ref 0–200)
HDL: 42 mg/dL (ref >39)
LDL Cholesterol: 95 mg/dL (ref 0–99)
Total CHOL/HDL Ratio: 3.9 Ratio
Triglycerides: 138 mg/dL (ref <150)
VLDL: 28 mg/dL (ref 0–40)

## 2014-07-10 LAB — POCT URINALYSIS DIPSTICK
Bilirubin, UA: NEGATIVE
Blood, UA: NEGATIVE
GLUCOSE UA: NEGATIVE
KETONES UA: NEGATIVE
Leukocytes, UA: NEGATIVE
NITRITE UA: NEGATIVE
PH UA: 6
Protein, UA: NEGATIVE
Spec Grav, UA: 1.015
Urobilinogen, UA: NEGATIVE

## 2014-07-10 LAB — COMPREHENSIVE METABOLIC PANEL
ALT: 12 U/L (ref 0–35)
AST: 16 U/L (ref 0–37)
Albumin: 4.1 g/dL (ref 3.5–5.2)
Alkaline Phosphatase: 50 U/L (ref 39–117)
BUN: 13 mg/dL (ref 6–23)
CALCIUM: 9.3 mg/dL (ref 8.4–10.5)
CHLORIDE: 103 meq/L (ref 96–112)
CO2: 25 mEq/L (ref 19–32)
CREATININE: 0.77 mg/dL (ref 0.50–1.10)
Glucose, Bld: 83 mg/dL (ref 70–99)
Potassium: 4.5 mEq/L (ref 3.5–5.3)
Sodium: 137 mEq/L (ref 135–145)
Total Bilirubin: 0.8 mg/dL (ref 0.2–1.2)
Total Protein: 6.6 g/dL (ref 6.0–8.3)

## 2014-07-10 LAB — VITAMIN D 25 HYDROXY (VIT D DEFICIENCY, FRACTURES): Vit D, 25-Hydroxy: 35 ng/mL (ref 30–89)

## 2014-07-10 LAB — TSH: TSH: 2.018 u[IU]/mL (ref 0.350–4.500)

## 2014-07-10 MED ORDER — CLOTRIMAZOLE-BETAMETHASONE 1-0.05 % EX CREA: 1.0000 "application " | TOPICAL_CREAM | Freq: Two times a day (BID) | CUTANEOUS | Status: DC

## 2014-07-10 MED ORDER — ESOMEPRAZOLE MAGNESIUM 40 MG PO CPDR: 40.0000 mg | DELAYED_RELEASE_CAPSULE | Freq: Every day | ORAL | Status: DC

## 2014-07-10 MED ORDER — MELOXICAM 15 MG PO TABS: ORAL_TABLET | ORAL | Status: DC

## 2014-07-10 NOTE — Progress Notes (Signed)
Subjective:    Patient ID: Ashley Preston, female    DOB: October 25, 1965, 48 y.o.   MRN: [id number]  HPI  She was here recently complaining of angioedema of the face and some joint pain of the upper extremities. She had extensive evaluation including rheumatology studies which proved to be negative. She works as a Marine scientist. 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 Wasn't certain what was causing angioedema. Perhaps it was some food intolerance. She was sent to Dr. Neldon Mc  for evaluation. Was given an EpiPen and is to return in the near future. Musculoskeletal pain has improved.  History resection  of left meningioma in 2005. 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 Was treated by Dr. Daylene Katayama with a thumb spica splint.  In May 2011 she was seen with generalized fatigue and joint pain. Was thought to be depressed. TSH was normal. History of low vitamin D level in August 2012.  History of Lasik surgery right eye 06/30/1997. History of vitreous detachment of both eyes. 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 Cholecystectomy in 2004. Myomectomy done in 2007. History of bilateral carpal tunnel syndrome in 2013.  Intolerant of sulfa-he causes rash and itching  Intolerant of cloth tape and paper tape-local reactions  Intolerant of Percocet causes a rash  Intolerant of Vicodin it causes a rash  Patient  has had one pregnancy and one miscarriage  History of kidney stones both right and left kidneys.  Social history: Patient works as a Marine scientist at [employer]. She has one child. Husband is a Environmental education officer. She has a Firefighter. Does not smoke or drink alcohol.  Family history: Father with history of COPD, mother with history of  hypertension. 2 brothers and 2 sisters in good health.    Review of Systems  Constitutional: Negative.   Gastrointestinal:       GERD  All other systems reviewed and are negative.      Objective:   Physical Exam  Constitutional: She is oriented to person, place, and time. She appears well-developed and well-nourished. No distress.  HENT:  Head: Normocephalic and atraumatic.  Right Ear: External ear normal.  Left Ear: External ear normal.  Mouth/Throat: Oropharynx is clear and moist. No oropharyngeal exudate.  Eyes: Conjunctivae and EOM are normal. Pupils are equal, round, and reactive to light. Right eye exhibits no discharge. Left eye exhibits no discharge. No scleral icterus.  Neck: Neck supple. No JVD present. No thyromegaly present.  Cardiovascular: Normal rate, regular rhythm, normal heart sounds and intact distal pulses.   No murmur heard. Pulmonary/Chest: Breath sounds normal. No respiratory distress. She has no wheezes. She has no rales. She exhibits no tenderness.  Breasts normal . Intertrigo between breasts.  Abdominal: Soft. Bowel sounds are normal. She exhibits no distension. There is no tenderness. There is no rebound and no guarding.  Genitourinary:  Deferred to Dr. Helane Rima GYN physician  Musculoskeletal: She exhibits no edema.  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  Neurological: She is alert and oriented to person, place, and time. She has normal reflexes. No cranial nerve deficit. Coordination normal.  Skin: Skin is dry. No rash noted. She is not diaphoretic.  Psychiatric:  She has a normal mood and affect. Her behavior is normal. Judgment and thought content normal.  Vitals reviewed.         Assessment & Plan:  Normal health maintenance exam  Musculoskeletal pain-treat with Mobic 15 mg daily  History of  angioedema-being seen by Dr. Neldon Mc  History of meningioma resected in  2005  Anxiety  Pruritus-may be due to anxiety  GERD-change Protonix to Nexium to see if itching improves  Intertrigo between breasts-prescribed Lotrisone cream  Plan: Return in one year or as needed

## 2014-07-10 NOTE — Patient Instructions (Signed)
Take Mobic for musculoskeletal pain. Change Protonix to Nexium to see if itching improves. Use Lotrisone cream for intertrigo between breasts. Return to see Dr. Neldon Mc. RTC one year or as needed. See GYN soon.

## 2014-09-03 ENCOUNTER — Telehealth: Payer: Self-pay | Admitting: Internal Medicine

## 2014-09-03 NOTE — Telephone Encounter (Signed)
Patient wanted to switch to 20 mg Nexium.  She stated she had no problems with 40 mg....just wanted to know if 20 mg would work.  Talked with patient for approx 15 minutes.  Advised that she needed to be seen to discuss this with Dr. Renold Genta.  Patient will call back to schedule an appointment at another time.

## 2014-09-20 ENCOUNTER — Other Ambulatory Visit: Payer: Self-pay

## 2014-09-20 DIAGNOSIS — Z1231 Encounter for screening mammogram for malignant neoplasm of breast: Secondary | ICD-10-CM

## 2014-10-09 ENCOUNTER — Encounter: Payer: Self-pay | Admitting: Internal Medicine

## 2014-10-09 ENCOUNTER — Ambulatory Visit (INDEPENDENT_AMBULATORY_CARE_PROVIDER_SITE_OTHER): Payer: BC Managed Care – PPO | Admitting: Internal Medicine

## 2014-10-09 VITALS — BP 106/70 | HR 71 | Temp 97.6°F | Wt 157.5 lb

## 2014-10-09 DIAGNOSIS — J069 Acute upper respiratory infection, unspecified: Secondary | ICD-10-CM

## 2014-10-09 DIAGNOSIS — K219 Gastro-esophageal reflux disease without esophagitis: Secondary | ICD-10-CM

## 2014-10-09 MED ORDER — BENZONATATE 100 MG PO CAPS: 100.0000 mg | ORAL_CAPSULE | Freq: Three times a day (TID) | ORAL | Status: DC | PRN

## 2014-10-09 MED ORDER — AZITHROMYCIN 250 MG PO TABS: ORAL_TABLET | ORAL | Status: DC

## 2014-10-30 ENCOUNTER — Ambulatory Visit: Payer: BC Managed Care – PPO

## 2014-11-07 ENCOUNTER — Telehealth: Payer: Self-pay | Admitting: Internal Medicine

## 2014-11-07 NOTE — Telephone Encounter (Signed)
Patient is calling wanting to know if she can reduce her Nexium to 20 mg instead of 40 mg?  Please call patient at [phone].

## 2014-11-07 NOTE — Telephone Encounter (Signed)
Nexium 20 mg is OTC. Why does she want to change?

## 2014-11-08 NOTE — Telephone Encounter (Signed)
Patient called back she states she didn't know Nexium came in 20mg  dose when she started this medication and she states she wants to take lowest dose possible . Let patient know nexium 20mg  is OTC and script is not necessary . She states she wants to try lower dose so she will try it if it doesn't work for her she will go back to 40mg  dose

## 2014-11-09 ENCOUNTER — Ambulatory Visit: Payer: BC Managed Care – PPO

## 2014-11-23 ENCOUNTER — Ambulatory Visit
Admission: RE | Admit: 2014-11-23 | Discharge: 2014-11-23 | Disposition: A | Discharge status: Home / Self Care | Payer: BLUE CROSS/BLUE SHIELD | Source: Ambulatory Visit

## 2014-11-23 ENCOUNTER — Encounter (INDEPENDENT_AMBULATORY_CARE_PROVIDER_SITE_OTHER): Payer: Self-pay

## 2014-11-23 DIAGNOSIS — Z1231 Encounter for screening mammogram for malignant neoplasm of breast: Secondary | ICD-10-CM

## 2014-12-05 NOTE — Patient Instructions (Signed)
Takes Zithromax Z-Pak and [employer] as directed. Call if not better in 7-10 days or sooner if worse.

## 2014-12-05 NOTE — Progress Notes (Signed)
   Subjective:    Patient ID: Ashley Preston, female    DOB: Sep 11, 1966, 49 y.o.   MRN: [id number]  HPI In today with acute URI symptoms. Has had cough and congestion. No fever or shaking chills. History of GE reflux treated with generic Nexium 40 mg daily with relief.  Review of Systems     Objective:   Physical Exam  TMs are clear. Neck is supple. Pharynx is clear. TMs are clear. Chest clear without rales or wheezing      Assessment & Plan:   Acute URI  GERD  Plan: Zithromax Z-PAK take 2 tablets day one followed by 1 tablet days 2 through 5. Tessalon Perles 200 mg 3 times daily as needed for cough. Call if not better in 7-10 days or sooner if worse. Continue Nexium.

## 2015-01-02 ENCOUNTER — Other Ambulatory Visit: Payer: Self-pay | Admitting: Internal Medicine

## 2015-01-18 ENCOUNTER — Other Ambulatory Visit: Payer: Self-pay | Admitting: Obstetrics and Gynecology

## 2015-01-23 LAB — CYTOLOGY - PAP

## 2015-01-30 ENCOUNTER — Other Ambulatory Visit: Payer: Self-pay | Admitting: Internal Medicine

## 2015-02-01 ENCOUNTER — Other Ambulatory Visit: Payer: Self-pay

## 2015-02-05 LAB — CYTOLOGY - PAP

## 2015-06-01 ENCOUNTER — Encounter (HOSPITAL_COMMUNITY): Payer: Self-pay | Admitting: *Deleted

## 2015-06-01 ENCOUNTER — Inpatient Hospital Stay (HOSPITAL_COMMUNITY)
Admission: AD | Admit: 2015-06-01 | Discharge: 2015-06-01 | Disposition: A | Discharge status: Home / Self Care | Payer: BLUE CROSS/BLUE SHIELD | Source: Ambulatory Visit | Attending: Obstetrics and Gynecology | Admitting: Obstetrics and Gynecology

## 2015-06-01 DIAGNOSIS — Z825 Family history of asthma and other chronic lower respiratory diseases: Secondary | ICD-10-CM | POA: Diagnosis not present

## 2015-06-01 DIAGNOSIS — Z809 Family history of malignant neoplasm, unspecified: Secondary | ICD-10-CM | POA: Diagnosis not present

## 2015-06-01 DIAGNOSIS — Z87442 Personal history of urinary calculi: Secondary | ICD-10-CM | POA: Diagnosis not present

## 2015-06-01 DIAGNOSIS — Z8249 Family history of ischemic heart disease and other diseases of the circulatory system: Secondary | ICD-10-CM | POA: Diagnosis not present

## 2015-06-01 DIAGNOSIS — R109 Unspecified abdominal pain: Secondary | ICD-10-CM | POA: Diagnosis not present

## 2015-06-01 DIAGNOSIS — N939 Abnormal uterine and vaginal bleeding, unspecified: Secondary | ICD-10-CM

## 2015-06-01 LAB — POCT PREGNANCY, URINE: PREG TEST UR: NEGATIVE

## 2015-06-01 LAB — URINALYSIS, ROUTINE W REFLEX MICROSCOPIC
BILIRUBIN URINE: NEGATIVE
Glucose, UA: NEGATIVE mg/dL
Hgb urine dipstick: NEGATIVE
KETONES UR: NEGATIVE mg/dL
LEUKOCYTES UA: NEGATIVE
Nitrite: NEGATIVE
PROTEIN: NEGATIVE mg/dL
Specific Gravity, Urine: 1.025 (ref 1.005–1.030)
UROBILINOGEN UA: 0.2 mg/dL (ref 0.0–1.0)
pH: 5.5 (ref 5.0–8.0)

## 2015-06-01 MED ORDER — IBUPROFEN 800 MG PO TABS: 800.0000 mg | ORAL_TABLET | Freq: Two times a day (BID) | ORAL | Status: AC

## 2015-06-01 NOTE — MAU Note (Signed)
Patient presents stating she is not pregnant with c/o vaginal bleeding X 3 months and abdominal "soreness" since yesterday. Denies discharge.

## 2015-06-01 NOTE — MAU Provider Note (Signed)
History     CSN: [id number]  Arrival date and time: 06/01/15 1129   First Provider Initiated Contact with Patient 06/01/15 1226      Chief Complaint  Patient presents with  . Abdominal Pain  . Vaginal Bleeding   HPI Ashley Preston 49 y.o.  Comes to MAU today with increased vaginal bleeding and soreness in her lower abdomen.  Bleeding was heavier last night and she did not sleep as well due to the lower abdominal soreness.  She did not take anything for pain as it only felt like soreness not pain.  She had called the office on Friday but did not get a call back.  When she called today the nurse said to come into the hospital to be evaluated.  Client is under the care of Dr. Helane Rima and saw her last this past Tuesday.  She has been  diagnosed with changed bleeding patterns likely due to being perimenopausal according to the client.  She had take birth control pills in the week before seeing Dr. Helane Rima and was taking 2 a day and had decreased to one per day.  She does not like birth control pills and wanted to know other options.  Dr. Helane Rima prescribed progesterone 100 mg tablets and she was taking them one a day since Wednesday.  But she also started bleeding again on Wednesday and it has been becoming heavier.  Since calling on the phone today, the bleeding has been some less.  She also is taking iron tablets and multivitamins.  Currently she is not taking Mobic - she just has it in case she might need it.  OB History    Gravida Para Term Preterm AB TAB SAB Ectopic Multiple Living   2 1   1  1   1       Past Medical History  Diagnosis Date  . Kidney stones     Past Surgical History  Procedure Laterality Date  . Hernia repair    . Cholecystectomy    . Cesarean section    . Resection of benign tumor in right parotid gland  [phone]  . Radiotherapy of right parietal gland  2000  . Eye surgery  1998    lasik  . Resection of left temporal frontal hemangioma  2005  . Inferior  oblique myectomy  2007  . Refractive surgery      Family History  Problem Relation Age of Onset  . Hypertension Mother   . COPD Father   . Cancer Paternal Uncle     colon    Social History  Substance Use Topics  . Smoking status: Never Smoker   . Smokeless tobacco: Never Used  . Alcohol Use: No    Allergies:  Allergies  Allergen Reactions  . Darvocet [Propoxyphene N-Acetaminophen] Itching and Rash  . Morphine And Related Rash  . Percocet [Oxycodone-Acetaminophen] Rash  . Sulfa Antibiotics Itching and Rash  . Tape Rash    Cloth tape & paper tape  . Vicodin [Hydrocodone-Acetaminophen] Rash  . Tramadol Itching    Prescriptions prior to admission  Medication Sig Dispense Refill Last Dose  . esomeprazole (NEXIUM) 40 MG capsule TAKE 1 CAPSULE (40 MG TOTAL) BY MOUTH DAILY. 90 capsule 2 06/01/2015 at Unknown time  . Multiple Vitamin (MULTI VITAMIN DAILY PO) Take 2 tablets by mouth.    05/31/2015 at Unknown time  . OVER THE COUNTER MEDICATION 2 tablets. Over the counter iron tablet 2 tablets daily   05/31/2015 at Unknown time  .  progesterone (PROMETRIUM) 100 MG capsule Take 100 mg by mouth daily.   05/31/2015 at Unknown time  . azithromycin (ZITHROMAX) 250 MG tablet 2 po day one followed by one po days 2-5 (Patient not taking: Reported on 06/01/2015) 6 tablet 0 Completed Course at Unknown time  . benzonatate (TESSALON) 100 MG capsule Take 1 capsule (100 mg total) by mouth 3 (three) times daily as needed for cough. (Patient not taking: Reported on 06/01/2015) 30 capsule 0 Not Taking at Unknown time  . clotrimazole-betamethasone (LOTRISONE) cream Apply 1 application topically 2 (two) times daily. (Patient not taking: Reported on 10/09/2014) 30 g 0 Not Taking at Unknown time  . meloxicam (MOBIC) 15 MG tablet One po daily prn musculoskeletal pain 90 tablet 0 prn    Review of Systems  Constitutional: Negative for fever.  Gastrointestinal: Negative for nausea, vomiting, diarrhea and  constipation.       Pelvic soreness  Genitourinary:       No vaginal discharge. Vaginal bleeding. No dysuria.   Physical Exam   Blood pressure 114/75, pulse 67, temperature 97.9 F (36.6 C), temperature source Oral, resp. rate 16, height 5\' 4"  (1.626 m), weight 158 lb (71.668 kg), last menstrual period 05/09/2015.  Physical Exam  Nursing note and vitals reviewed. Constitutional: She is oriented to person, place, and time. She appears well-developed and well-nourished.  HENT:  Head: Normocephalic.  Eyes: EOM are normal.  Neck: Neck supple.  Musculoskeletal: Normal range of motion.  Neurological: She is alert and oriented to person, place, and time.  Skin: Skin is warm and dry.  Psychiatric: She has a normal mood and affect.  Became teary while talking.  Her daughter starts kindergarten in another week.  They will be moving in a month or two.  She misses her family in [place] - her mother is not here while she is having this problem with the vaginal bleeding.    MAU Course  Procedures  MDM Discussed the findings in the office - she has had blood work and an ultrasound done there.  She has a follow up appointment in 4 weeks.  Discussed the course of her medications - she was on birth control pills and then stopped them and started the prometrium.  It would not be too unusual to begin bleeding with the stopping of the birth control pills.  The prometrium is used to stop bleeding but it will not be immediate. Client agreed to continue to take the Prometrium and see how her bleeding progresses.  If the bleeding is still heavy on Monday, will call the office for an appointment.  Assessment and Plan  Abnormal vaginal bleeding likely due to being perimenopausal  Plan Continue prometrium as prescribed. Will prescribe ibuprofen to also help the bleeding slow.  Client is not taking Mobic and was counseled not to take it while on a short course of ibuprofen. Call the office on Monday if the  bleeding is not slowing down. Return here if the bleeding becomes severe.  BURLESON,TERRI 06/01/2015, 12:58 PM

## 2015-06-18 ENCOUNTER — Ambulatory Visit: Payer: Self-pay | Admitting: Internal Medicine

## 2016-05-20 ENCOUNTER — Ambulatory Visit: Attending: Family Medicine | Primary: Family Medicine

## 2016-05-20 DIAGNOSIS — L659 Nonscarring hair loss, unspecified: Principal | ICD-10-CM

## 2016-05-20 DIAGNOSIS — R140 Abdominal distension (gaseous): Secondary | ICD-10-CM

## 2016-05-20 DIAGNOSIS — N920 Excessive and frequent menstruation with regular cycle: Secondary | ICD-10-CM

## 2016-05-20 DIAGNOSIS — L989 Disorder of the skin and subcutaneous tissue, unspecified: Secondary | ICD-10-CM

## 2016-05-20 DIAGNOSIS — Z1211 Encounter for screening for malignant neoplasm of colon: Secondary | ICD-10-CM

## 2016-05-20 MED ORDER — PROGESTERONE MICRONIZED 100 MG PO CAPS
0 refills | Status: CP
Start: 2016-05-20 — End: 2016-06-19

## 2016-05-21 ENCOUNTER — Encounter: Attending: Obstetrics & Gynecology | Primary: Family Medicine

## 2016-05-21 NOTE — Progress Notes
2. Skin lesion L98.9 709.9 Refer to Dermatology   3. Menorrhagia with regular cycle N92.0 626.2 progesterone (PROMETRIUM) 100 MG Capsule      TSH      TSH   4. Colon cancer screening Z12.11 V76.51 Refer to Gastroenterology   5. Abdominal bloating R14.0 787.3           Plan:    Alopecia - referring to dermatology.  Menorrhagia - refilled pt's prometrium for 1 month only  Abdoming bloating - pt to try OTC probiotics, exercise regularly and is welcome to do gluten free diet to see if would make a difference; pt warned may take a month or so before notices a difference.  Ordered colon screening since pt is 49 and has not yet had.  Skin lesion - advised pt that the area on the L posterior lower leg looks like has healed; she may have some scarring so may never look or feel "normal" again but since the area is "tight" and itchy (even prior to the mosquito bites), she would like a dermology opinion. They may do biopsy if they think it necessary.

## 2016-05-21 NOTE — Progress Notes
abdominal pain, constipation and diarrhea.   Genitourinary: Negative for difficulty urinating and dysuria.   Musculoskeletal: Negative for back pain.   Skin: Positive for wound. Negative for rash.   Neurological: Negative for dizziness and headaches.   Psychiatric/Behavioral: Positive for confusion. Negative for behavioral problems.         Objective:        VITAL SIGNS (all recorded)      Clinic Vitals       05/20/16 1502             Amb Encounter Vitals    Weight 73.8 kg (162 lb 9.6 oz)    -HS at 05/20/16 1503       Height 1.626 m (5\' 4" )    -HS at 05/20/16 1503       BMI (Calculated) 27.97    -HS at 05/20/16 1503       BSA (Calculated - sq m) 1.82    -HS at 05/20/16 1503       BP 122/81    -HS at 05/20/16 1503       Pulse 74    -HS at 05/20/16 1503       Temp 36.4 ?C (97.6 ?F)    -HS at 05/20/16 1503         User Key  (r) = Recorded By, (t) = Taken By, (c) = Cosigned By    Initials Name Effective Dates    HS Sarikaya, Halit, MA 01/14/16 -         Physical Exam   Constitutional: She appears well-developed and well-nourished.   BMI 27   HENT:   Head: Normocephalic.   Eyes: Conjunctivae are normal.   Cardiovascular: Normal rate, regular rhythm and normal heart sounds.    Pulmonary/Chest: Effort normal and breath sounds normal.   Abdominal: Soft. Bowel sounds are normal.   Neurological: She is alert.   Skin: Skin is warm.   Old abrasion seemingly healed abrasion on L posterior lower leg with mild scarring and 3 red bug bites overlying. Multiple other red bug bites on legs bilaterally and both arms; general hair thinning without bald spots.   Psychiatric: She has a normal mood and affect. Her behavior is normal.   Nursing note and vitals reviewed.      Assessment:       ICD-10-CM ICD-9-CM    1. Alopecia L65.9 704.00 Comprehensive Metabolic Panel      TSH      CBC and Differential      Comprehensive Metabolic Panel      TSH      CBC and Differential      Refer to Dermatology

## 2016-05-21 NOTE — Progress Notes
Reason:  Physician ordered labs  Amount:  1 lav 1 sst Tubes  Type:  Vaccutainer  Site:  Vein  left arm  Reaction:  None    Draw performed by:  [id number],  05/20/2016
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
## 2016-06-04 ENCOUNTER — Encounter: Attending: Obstetrics & Gynecology | Primary: Family Medicine

## 2016-06-19 DIAGNOSIS — N920 Excessive and frequent menstruation with regular cycle: Principal | ICD-10-CM

## 2016-06-19 MED ORDER — PROGESTERONE MICRONIZED 100 MG PO CAPS
0 refills | Status: CP
Start: 2016-06-19 — End: ?

## 2016-06-19 NOTE — Telephone Encounter
Last OV 05/20/16.

## 2016-08-04 DIAGNOSIS — Z79899 Other long term (current) drug therapy: Secondary | ICD-10-CM

## 2016-08-04 DIAGNOSIS — Z1211 Encounter for screening for malignant neoplasm of colon: Principal | ICD-10-CM

## 2016-08-04 DIAGNOSIS — K648 Other hemorrhoids: Secondary | ICD-10-CM

## 2016-08-04 DIAGNOSIS — Z9049 Acquired absence of other specified parts of digestive tract: Secondary | ICD-10-CM

## 2016-08-04 MED ORDER — PEG 3350-KCL-NABCB-NACL-NASULF 236 G PO SOLR
4000 mL | Freq: Once | ORAL | 0 refills | Status: CP
Start: 2016-08-04 — End: ?

## 2016-08-27 ENCOUNTER — Inpatient Hospital Stay

## 2016-08-27 DIAGNOSIS — N200 Calculus of kidney: Principal | ICD-10-CM

## 2016-08-27 DIAGNOSIS — D329 Benign neoplasm of meninges, unspecified: Secondary | ICD-10-CM

## 2016-08-27 DIAGNOSIS — Z9109 Other allergy status, other than to drugs and biological substances: Secondary | ICD-10-CM

## 2016-08-27 MED ORDER — MIDAZOLAM HCL 2 MG/2ML IJ SOLN
Status: DC | PRN
Start: 2016-08-27 — End: 2016-08-27

## 2016-08-27 MED ORDER — ONDANSETRON HCL 4 MG/2ML IJ SOLN
4 mg | Freq: Four times a day (QID) | INTRAVENOUS | Status: DC | PRN
Start: 2016-08-27 — End: 2016-08-28

## 2016-08-27 MED ORDER — FENTANYL CITRATE INJ 50 MCG/ML CUSTOM AMP/VIAL
Status: DC | PRN
Start: 2016-08-27 — End: 2016-08-27

## 2016-08-27 MED ORDER — SODIUM CHLORIDE 0.9 % IV SOLN
5 mL/h | INTRAVENOUS | Status: DC
Start: 2016-08-27 — End: 2016-08-28

## 2016-08-27 MED ORDER — MIDAZOLAM HCL 2 MG/2ML IJ SOLN
.5-1 mg | INTRAVENOUS | Status: DC | PRN
Start: 2016-08-27 — End: 2016-08-28

## 2016-08-27 MED ORDER — DIPHENHYDRAMINE HCL 50 MG/ML IJ SOLN
25-50 mg | Freq: Four times a day (QID) | INTRAVENOUS | Status: DC | PRN
Start: 2016-08-27 — End: 2016-08-28

## 2016-08-27 MED ORDER — FLUMAZENIL 0.5 MG/5ML IV SOLN
.2 mg | INTRAVENOUS | Status: DC | PRN
Start: 2016-08-27 — End: 2016-08-28

## 2016-08-27 MED ORDER — FENTANYL CITRATE INJ 50 MCG/ML CUSTOM AMP/VIAL
25-50 ug | INTRAVENOUS | Status: DC | PRN
Start: 2016-08-27 — End: 2016-08-28

## 2016-08-27 MED ORDER — NALOXONE HCL 2 MG/2ML IJ SOSY
.2 mg | INTRAVENOUS | Status: DC | PRN
Start: 2016-08-27 — End: 2016-08-28

## 2016-08-27 NOTE — H&P
Sedation consent signed and in MEDICAL RECORD [id number]    In light of the above evaluation, I believe this patient is an acceptable candidate for the procedure planned as outlined above.    Adelene AmasJames S Scolapio  08/27/2016 12:35 PM

## 2016-08-27 NOTE — H&P
Department of Medicine  Division of Gastroenterology, Hepatology & Nutrition    Procedure Planned: Colonoscopy    Reason for Procedure: Screening for colon cancer-average risk        History: Past Medical History:   Diagnosis Date   ? Allergy to environmental factors    ? Kidney stones    ? Meningioma      Past Surgical History:   Procedure Laterality Date   ? BRAIN SURGERY     ? CESAREAN SECTION     ? CHOLECYSTECTOMY     ? EYE SURGERY     ? HEMORRHOID SURGERY     ? MYOMECTOMY     ? PAROTIDECTOMY          I have reviewed the past medical and surgical, history.    Home Medications:  Prescriptions Prior to Admission   Medication Sig   ? esomeprazole (NexIUM) 40 MG Capsule Delayed Release TK ONE C PO QD   ? progesterone (PROMETRIUM) 100 MG Capsule TK ONE C PO QHS       Current Hospital Medications:  Scheduled:    Continuous Infusions:   ? 0.9 % NaCl       PRN:   diphenhydrAMINE 25-50 mg Intravenous Q6H PRN   fentaNYL 25-50 mcg Intravenous Q5 Min PRN   flumazenil 0.2 mg Intravenous PRN   midazolam 0.5-1 mg Intravenous Q5 Min PRN   nalOXone (NARCAN) injection 0.2 mg Intravenous PRN   ondansetron 4 mg Intravenous Q6H PRN        Allergies:  is allergic to darvocet [propoxyphene n-apap]; percocet [perloxx]; sulfa drugs; tape; and tramadol.    Review of Systems  Respiratory: Negative  Cardiovascular: Negative  Gastrointestinal: Negative    Airway: Open, uncompromised    NPO since: pm      Physical Exam  Lungs: clear to auscultation bilaterally  Heart: regular rate and rhythm, S1, S2 normal, no murmur, click, rub or gallop  Abdomen: soft, non-tender; bowel sounds normal; no masses, no organomegaly    Assessment / Diagnosis: ok    ASA:  Class I ? normal healthy patient    Previous Anesthesia: None known    Problems/Reaction: no    Any Contraindications for Sedation?: no    Anesthetic/Sedation Plan: General/MAC Anesthesia requested    Options and risks discussed with Patient

## 2016-08-27 NOTE — H&P
Department of Medicine  Division of Gastroenterology, Hepatology & Nutrition    Procedure Planned: Colonoscopy    Reason for Procedure: Screening for colon cancer-average risk        History: Past Medical History:   Diagnosis Date   ? Allergy to environmental factors    ? Kidney stones    ? Meningioma      Past Surgical History:   Procedure Laterality Date   ? BRAIN SURGERY     ? CESAREAN SECTION     ? CHOLECYSTECTOMY     ? EYE SURGERY     ? HEMORRHOID SURGERY     ? MYOMECTOMY     ? PAROTIDECTOMY          I have reviewed the past medical and surgical, history.    Home Medications:  Prescriptions Prior to Admission   Medication Sig   ? esomeprazole (NexIUM) 40 MG Capsule Delayed Release TK ONE C PO QD   ? progesterone (PROMETRIUM) 100 MG Capsule TK ONE C PO QHS       Current Hospital Medications:  Scheduled:    Continuous Infusions:   ? 0.9 % NaCl       PRN:   diphenhydrAMINE 25-50 mg Intravenous Q6H PRN   fentaNYL 25-50 mcg Intravenous Q5 Min PRN   flumazenil 0.2 mg Intravenous PRN   midazolam 0.5-1 mg Intravenous Q5 Min PRN   nalOXone (NARCAN) injection 0.2 mg Intravenous PRN   ondansetron 4 mg Intravenous Q6H PRN        Allergies:  is allergic to darvocet [propoxyphene n-apap]; percocet [perloxx]; sulfa drugs; tape; and tramadol.    Review of Systems  Respiratory: Negative  Cardiovascular: Negative  Gastrointestinal: Negative    Airway: Open, uncompromised    NPO since: pm      Physical Exam  Lungs: clear to auscultation bilaterally  Heart: regular rate and rhythm, S1, S2 normal, no murmur, click, rub or gallop  Abdomen: soft, non-tender; bowel sounds normal; no masses, no organomegaly    Assessment / Diagnosis: ok    ASA:  Class I ? normal healthy patient    Previous Anesthesia: None known    Problems/Reaction: no    Any Contraindications for Sedation?: no    Anesthetic/Sedation Plan: General/MAC Anesthesia requested    Options and risks discussed with Patient

## 2016-08-27 NOTE — H&P
Sedation consent signed and in Medical Record: yes    In light of the above evaluation, I believe this patient is an acceptable candidate for the procedure planned as outlined above.    James S Scolapio  08/27/2016 12:35 PM

## 2016-08-28 DIAGNOSIS — N200 Calculus of kidney: Secondary | ICD-10-CM

## 2016-08-28 DIAGNOSIS — D329 Benign neoplasm of meninges, unspecified: Secondary | ICD-10-CM

## 2016-08-28 DIAGNOSIS — Z9109 Other allergy status, other than to drugs and biological substances: Secondary | ICD-10-CM

## 2016-09-16 ENCOUNTER — Ambulatory Visit: Attending: Family Medicine | Primary: Family Medicine

## 2016-09-16 DIAGNOSIS — D329 Benign neoplasm of meninges, unspecified: Secondary | ICD-10-CM

## 2016-09-16 DIAGNOSIS — M25552 Pain in left hip: Secondary | ICD-10-CM

## 2016-09-16 DIAGNOSIS — Z6827 Body mass index (BMI) 27.0-27.9, adult: Secondary | ICD-10-CM

## 2016-09-16 DIAGNOSIS — Z9109 Other allergy status, other than to drugs and biological substances: Secondary | ICD-10-CM

## 2016-09-16 DIAGNOSIS — N200 Calculus of kidney: Principal | ICD-10-CM

## 2016-09-16 DIAGNOSIS — Z23 Encounter for immunization: Secondary | ICD-10-CM

## 2016-09-16 DIAGNOSIS — Z86011 Personal history of benign neoplasm of the brain: Principal | ICD-10-CM

## 2016-09-16 DIAGNOSIS — M7062 Trochanteric bursitis, left hip: Secondary | ICD-10-CM

## 2016-09-16 DIAGNOSIS — R51 Headache: Secondary | ICD-10-CM

## 2016-09-16 MED ORDER — GAVILYTE-G 236 G PO SOLR
0 refills
Start: 2016-09-16 — End: ?

## 2016-09-16 MED ORDER — PREVIDENT 5000 BOOSTER PLUS 1.1 % DT PSTE
6 refills
Start: 2016-09-16 — End: ?

## 2016-09-16 NOTE — Progress Notes
History of meningioma with current headaches - ordering MRA to make sure that her meningioma is not going back.  Given flu shot today.  Patient is due for a fasting physical within the next couple months. We'll update more of her health maintenance at that visit.

## 2016-09-16 NOTE — Progress Notes
?   Darvocet [Propoxyphene N-Apap] Itching   ? Percocet [Perloxx] Itching   ? Sulfa Drugs Itching   ? Tape Itching and Other (See Comments)     Itching and redness with cloth tape   ? Tramadol Itching         Review of Systems  Review of Systems   Constitutional: Negative for activity change, appetite change and fatigue.   HENT: Negative for congestion and hearing loss.    Respiratory: Negative for cough, shortness of breath and wheezing.    Cardiovascular: Negative for chest pain and palpitations.   Gastrointestinal: Negative for abdominal pain, constipation and diarrhea.   Musculoskeletal: Positive for arthralgias. Negative for back pain.   Skin: Negative for rash.   Neurological: Positive for headaches. Negative for dizziness.   Psychiatric/Behavioral: Negative for behavioral problems.           Objective:        VITAL SIGNS (all recorded)      Clinic Vitals       09/16/16 0905             Amb Encounter Vitals    Weight 71.7 kg (158 lb)    -LU at 09/16/16 0906       Height 1.626 m (5\' 4" )    -LU at 09/16/16 0906       BMI (Calculated) 27.18    -LU at 09/16/16 0906       BSA (Calculated - sq m) 1.8    -LU at 09/16/16 0906       BP 117/76    -LU at 09/16/16 0906       BP Location Left upper arm    -LU at 09/16/16 0906       Position Sitting    -LU at 09/16/16 0906       Pulse 68    -LU at 09/16/16 0906       Temp 37 ?C (98.6 ?F)    -LU at 09/16/16 0906       Temperature Source Oral    -LU at 09/16/16 [id number]         User Key  (r) = Recorded By, (t) = Taken By, (c) = Cosigned By    Initials Name Effective Dates    LU Jacqueline Trevino, Jacqueline D, MA 01/14/16 -         Physical Exam   Constitutional: She appears well-developed and well-nourished.   BMI 27     HENT:   Head: Normocephalic.   Eyes: Conjunctivae are normal.   Cardiovascular: Normal rate, regular rhythm and normal heart sounds.    Pulmonary/Chest: Effort normal and breath sounds normal.   Abdominal: Soft. Bowel sounds are normal.   Musculoskeletal: She exhibits tenderness.

## 2016-09-16 NOTE — Progress Notes
Subjective:   Jacqueline Trevino is a 50 y.o. female being seen today for left hip pain       HPI   L hip pain - has h/o of L lateral thigh pain; has h/o of bursitis years ago; had been bothering her for a week but is getting a bit better. She has not taken any antiinflammatory. Just wants to be sure is not anything else.  Brain tumor 2005 - had meningioma and now having a bit of headache so would like an MRI to make sure.  Left frontal headache - as above patient had a meningioma that was removed in approximately 2005 in a different state. She is now having intermittent headache and is worried that it may be coming back.  Patient would like a flu vaccine.  Past Medical History:   Diagnosis Date   ? Allergy to environmental factors    ? Kidney stones    ? Meningioma      Past Surgical History:   Procedure Laterality Date   ? BRAIN SURGERY     ? CESAREAN SECTION     ? CHOLECYSTECTOMY     ? COLONOSCOPY W/ OR W/O BIOPSY Left 08/27/2016    COLONOSCOPY W/ OR W/O BIOPSY performed by Laveda Abbe[facility]colapio[facility]   ? EYE SURGERY     ? HEMORRHOID SURGERY     ? MYOMECTOMY     ? PAROTIDECTOMY       Family History   Problem Relation Age of Onset   ? Skin Cancer Neg Hx      Social History     Social History   ? Marital status: Married     Spouse name: N/A   ? Number of children: N/A   ? Years of education: N/A     Occupational History   ? Not on file.     Social History Main Topics   ? Smoking status: Never Smoker   ? Smokeless tobacco: Never Used   ? Alcohol use No   ? Drug use: No   ? Sexual activity: Not on file     Other Topics Concern   ? Not on file     Social History Narrative     Current Outpatient Prescriptions on File Prior to Visit   Medication Sig   ? esomeprazole (NexIUM) 40 MG Capsule Delayed Release TK ONE C PO QD   ? progesterone (PROMETRIUM) 100 MG Capsule TK ONE C PO QHS     No current facility-administered medications on file prior to visit.      Allergies   Allergen Reactions

## 2016-09-16 NOTE — Progress Notes
Tenderness over trochanter of L Femur   Neurological: She is alert.   Scars seen in the left frontal area.   Skin: Skin is warm.   Psychiatric: She has a normal mood and affect.   Nursing note and vitals reviewed.       Assessment:       ICD-10-CM ICD-9-CM    1. History of meningioma of the brain Z86.011 V12.41 MRI Brain w/o & w Con   2. Acute nonintractable headache, unspecified headache type R51 784.0 MRI Brain w/o & w Con   3. Trochanteric bursitis of left hip M70.62 726.5 XR Hip Left Min 2 Views   4. Hip pain, acute, left M25.552 719.45 XR Hip Left Min 2 Views   5. Need for prophylactic vaccination and inoculation against influenza Z23 V04.81 Flu vaccine (3yo and older) QUADRIVALENT, Preserv-Free, 0.5 mL, Split IM, Pre-Filled Syringe   6. BMI 27.0-27.9,adult Z68.27 V85.23           Plan:   Health Maintenance was reviewed. The patient's HM Topic list was:                                            Health Maintenance   Topic Date Due   ? USPSTF HIV Risk Assessment  07/10/1980   ? Preventive Wellness Visit  07/11/1983   ? Pap Smear  07/10/1986   ? Lipid Profile  07/10/2010   ? Breast Cancer Screening  07/11/2015   ? Influenza Vaccine (1) 06/12/2016   ? Colon Cancer Screening  08/27/2026   ? DTaP,Tdap,and Td Vaccines  Excluded        Rosielee's Estimated body mass index is 27.12 kg/(m^2) as calculated from the following:    Height as of this encounter: 1.626 m (5\' 4" ).    Weight as of this encounter: 71.7 kg (158 lb).      The health risks associated with an elevated BMI were discussed and education was provided in the AVS.  See orders for any further follow up plans.    When patient's hip pain has improved, she will need to walk more and decrease carbohydrates in general.    Left hip and lateral pain - likely trochanteric bursitis. Per patient is improving a little bit so she is not interested in a injection at this time. She would just like an x-ray to make sure that it is not anything else. X-ray ordered but then

## 2016-09-30 DIAGNOSIS — R51 Headache: Secondary | ICD-10-CM

## 2016-09-30 DIAGNOSIS — M2550 Pain in unspecified joint: Principal | ICD-10-CM

## 2016-09-30 DIAGNOSIS — M791 Myalgia, unspecified site: Secondary | ICD-10-CM

## 2016-09-30 DIAGNOSIS — Z86018 Personal history of other benign neoplasm: Secondary | ICD-10-CM

## 2016-09-30 NOTE — Telephone Encounter
Pt needs to talk to you about her results if you can please call her today on her cell [phone]

## 2016-09-30 NOTE — Telephone Encounter
Spoke to patient; would like to see neurology; referral put in and would like arthritis labs as says she is achy all over.

## 2016-10-02 DIAGNOSIS — M791 Myalgia, unspecified site: Principal | ICD-10-CM

## 2016-10-02 MED ORDER — CYCLOBENZAPRINE HCL 5 MG PO TABS
5 mg | Freq: Every evening | ORAL | 2 refills | Status: CP | PRN
Start: 2016-10-02 — End: ?

## 2016-10-20 DIAGNOSIS — R310 Gross hematuria: Principal | ICD-10-CM

## 2016-10-29 ENCOUNTER — Ambulatory Visit: Attending: Family Medicine | Primary: Family Medicine

## 2016-10-29 DIAGNOSIS — N200 Calculus of kidney: Principal | ICD-10-CM

## 2016-10-29 DIAGNOSIS — Z6827 Body mass index (BMI) 27.0-27.9, adult: Secondary | ICD-10-CM

## 2016-10-29 DIAGNOSIS — Z9109 Other allergy status, other than to drugs and biological substances: Secondary | ICD-10-CM

## 2016-10-29 DIAGNOSIS — Z0000 Encounter for general adult medical examination without abnormal findings: Principal | ICD-10-CM

## 2016-10-29 DIAGNOSIS — D329 Benign neoplasm of meninges, unspecified: Secondary | ICD-10-CM

## 2016-10-29 DIAGNOSIS — M2550 Pain in unspecified joint: Principal | ICD-10-CM

## 2016-10-29 DIAGNOSIS — Z113 Encounter for screening for infections with a predominantly sexual mode of transmission: Secondary | ICD-10-CM

## 2016-10-29 MED ORDER — MELOXICAM 15 MG PO TABS
15 mg | Freq: Every day | ORAL | 2 refills | Status: CP
Start: 2016-10-29 — End: ?

## 2016-10-29 NOTE — Progress Notes
Reason:  Physician ordered labs  Amount:  1 lav 2 sstTubes  Type:  Vaccutainer  Site:  Vein  left arm  Reaction:  None    Draw performed by:  [id number],  10/29/2016

## 2016-10-29 NOTE — Progress Notes
Eyes: Conjunctivae and EOM are normal.   Neck: Normal range of motion. Neck supple.   Cardiovascular: Normal rate, regular rhythm and normal heart sounds.    Pulmonary/Chest: Effort normal and breath sounds normal.   Abdominal: Soft. Bowel sounds are normal.   Musculoskeletal: Normal range of motion.   Neurological: She is alert and oriented to person, place, and time.   Skin: Skin is warm.   Psychiatric: She has a normal mood and affect. Her behavior is normal.   Nursing note and vitals reviewed.       Assessment:       ICD-10-CM ICD-9-CM    1. Annual physical exam Z00.00 V70.0 Lipid Panel      Comprehensive Metabolic Panel      CBC and Differential      HIV 1/2 ANTIGEN/ANTIBODY,4TH GEN W/REFLX      Lipid Panel      Comprehensive Metabolic Panel      CBC and Differential      HIV 1/2 ANTIGEN/ANTIBODY,4TH GEN W/REFLX   2. BMI 27.0-27.9,adult Z68.27 V85.23    3. Screening for STD (sexually transmitted disease) Z11.3 V74.5 HIV 1/2 ANTIGEN/ANTIBODY,4TH GEN W/REFLX      HIV 1/2 ANTIGEN/ANTIBODY,4TH GEN W/REFLX          Plan:       Health Maintenance was reviewed. The patient's HM Topic list was:                                            Health Maintenance   Topic Date Due   ? USPSTF HIV Risk Assessment  07/10/1980   ? Preventive Wellness Visit  07/11/1983   ? Pap Smear  07/10/1986   ? Lipid Profile  07/10/2010   ? Breast Cancer Screening  07/11/2015   ? Zoster Vaccine (1) 07/10/2025   ? Colon Cancer Screening  08/27/2026   ? Influenza Vaccine  Completed   ? DTaP,Tdap,and Td Vaccines  Excluded     Jacqueline Trevino's Estimated body mass index is 27.46 kg/(m^2) as calculated from the following:    Height as of this encounter: 1.626 m (5\' 4" ).    Weight as of this encounter: 72.6 kg (160 lb).      The health risks associated with an elevated BMI were discussed and education was provided in the AVS.  See orders for any further follow up plans.    BMI slightly high - needs to walk daily and try to eat healthier including

## 2016-10-29 NOTE — Progress Notes
more fruits, vegetables and less carbs.    Patient doing well.   F/u as needed otherwise in 1 yr.  Encouraged to sign up for patient portal.  Pt to sign med release for GYN for last pap report and mammo report.

## 2016-10-29 NOTE — Progress Notes
Subjective:   Jacqueline Trevino is a [age] y.o. female being seen today for PHYSICAL EXAM (pt due for HIV assess, breast cancer screen, pap )       HPI   Physical  - is fasting today.  Had pap smear - July 30 2016 with GYN, Dr. De NursePaul Willford. Will request records  Mammo - had late 2017; had screening and then diagnostic with mammo with ultrasound; rpt in 1 yr.  Had colonoscopy 08/27/16; recall in 10 years.  Has gone to dentist for cleaning.        Past Medical History:   Diagnosis Date   ? Allergy to environmental factors    ? Kidney stones    ? Meningioma      Past Surgical History:   Procedure Laterality Date   ? BRAIN SURGERY     ? CESAREAN SECTION     ? CHOLECYSTECTOMY     ? COLONOSCOPY W/ OR W/O BIOPSY Left 08/27/2016    COLONOSCOPY W/ OR W/O BIOPSY [facility]aveda [facility]colapio[facility]   ? EYE SURGERY     ? HEMORRHOID SURGERY     ? MYOMECTOMY     ? PAROTIDECTOMY       Family History   Problem Relation Age of Onset   ? High Blood Pressure Mother    ? Diabetes Mother    ? Heart Failure Mother    ? COPD Father    ? Skin Cancer Neg Hx      Social History     Social History   ? Marital status: Married     Spouse name: N/A   ? Number of children: N/A   ? Years of education: N/A     Occupational History   ? Not on file.     Social History Main Topics   ? Smoking status: Never Smoker   ? Smokeless tobacco: Never Used   ? Alcohol use No   ? Drug use: No   ? Sexual activity: Not on file     Other Topics Concern   ? Not on file     Social History Narrative     Current Outpatient Prescriptions on File Prior to Visit   Medication Sig   ? esomeprazole (NexIUM) 40 MG Capsule Delayed Release TK ONE C PO QD   ? PREVIDENT 5000 BOOSTER PLUS 1.1 % DT Paste    ? cyclobenzaprine (FLEXERIL) 5 MG PO Tablet Take 1 tablet by mouth nightly at bedtime as needed for muscle spasms.   ? GAVILYTE-G 236 G PO Solution Reconstituted    ? progesterone (PROMETRIUM) 100 MG Capsule TK ONE C PO QHS

## 2016-10-29 NOTE — Progress Notes
No current facility-administered medications on file prior to visit.      Allergies   Allergen Reactions   ? Darvocet [Propoxyphene N-Apap] Itching   ? Percocet [Perloxx] Itching   ? Sulfa Drugs Itching   ? Tape Itching and Other (See Comments)     Itching and redness with cloth tape   ? Tramadol Itching         Review of Systems  Review of Systems   Constitutional: Negative for activity change, appetite change and fatigue.   HENT: Negative for congestion and hearing loss.    Respiratory: Negative for cough, shortness of breath and wheezing.    Cardiovascular: Negative for chest pain and palpitations.   Gastrointestinal: Negative for abdominal pain, constipation and diarrhea.   Genitourinary: Negative for difficulty urinating and dysuria.   Musculoskeletal: Negative for back pain.   Skin: Negative for rash.   Neurological: Negative for dizziness and headaches.   Psychiatric/Behavioral: Negative for behavioral problems.           Objective:        VITAL SIGNS (all recorded)      Clinic Vitals       10/29/16 0909             Amb Encounter Vitals    Weight 72.6 kg (160 lb)    -HJ at 10/29/16 0914       Height 1.626 m (5\' 4" )    -HJ at 10/29/16 0914       BMI (Calculated) 27.52    -HJ at 10/29/16 0914       BSA (Calculated - sq m) 1.81    -HJ at 10/29/16 0914       BP 123/78    -HJ at 10/29/16 0914       BP Location Left upper arm    -HJ at 10/29/16 0914       Position Sitting    -HJ at 10/29/16 0914       Pulse 62    -HJ at 10/29/16 0914       Pulse Source Radial    -HJ at 10/29/16 0914       Temp 37.1 ?C (98.7 ?F)    -HJ at 10/29/16 0914       Temperature Source Oral    -HJ at 10/29/16 [id number]         User Key  (r) = Recorded By, (t) = Taken By, (c) = Cosigned By    Initials Name Effective Dates    De BlanchHJ Jackiewicz, Heather, MA 01/14/16 -         Physical Exam   Constitutional: She is oriented to person, place, and time. She appears well-developed and well-nourished.   BMI 27   HENT:   Head: Normocephalic.

## 2016-11-03 DIAGNOSIS — Z1231 Encounter for screening mammogram for malignant neoplasm of breast: Principal | ICD-10-CM
